# Patient Record
Sex: Male | Born: 1980 | Race: Black or African American | Hispanic: No | Marital: Single | Smoking: Never smoker
Health system: Southern US, Community
[De-identification: ages and names within clinical notes are randomized; demographics above are authoritative.]

## PROBLEM LIST (undated history)

## (undated) DIAGNOSIS — I517 Cardiomegaly: Secondary | ICD-10-CM

## (undated) HISTORY — PX: NASAL FRACTURE SURGERY: SHX718

## (undated) HISTORY — PX: HERNIA REPAIR: SHX51

## (undated) HISTORY — PX: KNEE SURGERY: SHX244

## (undated) HISTORY — PX: CIRCUMCISION: SUR203

---

## 2013-11-12 ENCOUNTER — Encounter (HOSPITAL_COMMUNITY): Payer: Self-pay | Admitting: Emergency Medicine

## 2013-11-12 ENCOUNTER — Emergency Department (INDEPENDENT_AMBULATORY_CARE_PROVIDER_SITE_OTHER)
Admission: EM | Admit: 2013-11-12 | Discharge: 2013-11-12 | Disposition: A | Discharge status: Home / Self Care | Payer: Self-pay | Source: Home / Self Care | Attending: Family Medicine | Admitting: Family Medicine

## 2013-11-12 ENCOUNTER — Other Ambulatory Visit (HOSPITAL_COMMUNITY)
Admission: RE | Admit: 2013-11-12 | Discharge: 2013-11-12 | Disposition: A | Discharge status: Home / Self Care | Payer: Self-pay | Source: Ambulatory Visit | Attending: Family Medicine | Admitting: Family Medicine

## 2013-11-12 DIAGNOSIS — N342 Other urethritis: Secondary | ICD-10-CM

## 2013-11-12 DIAGNOSIS — Z113 Encounter for screening for infections with a predominantly sexual mode of transmission: Secondary | ICD-10-CM | POA: Insufficient documentation

## 2013-11-12 LAB — RPR

## 2013-11-12 LAB — HIV ANTIBODY (ROUTINE TESTING W REFLEX): HIV: NONREACTIVE

## 2013-11-12 MED ORDER — AZITHROMYCIN 250 MG PO TABS
1000.0000 mg | ORAL_TABLET | Freq: Every day | ORAL | Status: DC
  Administered 2013-11-12: 1000 mg via ORAL

## 2013-11-12 MED ORDER — CEFTRIAXONE SODIUM 250 MG IJ SOLR
250.0000 mg | Freq: Once | INTRAMUSCULAR | Status: AC
  Administered 2013-11-12: 250 mg via INTRAMUSCULAR

## 2013-11-12 MED ORDER — LIDOCAINE HCL (PF) 1 % IJ SOLN
INTRAMUSCULAR | Status: AC
  Filled 2013-11-12: qty 5

## 2013-11-12 MED ORDER — METRONIDAZOLE 500 MG PO TABS: 2000.0000 mg | ORAL_TABLET | Freq: Once | ORAL | Status: DC

## 2013-11-12 MED ORDER — METRONIDAZOLE 500 MG PO TABS: 1000.0000 mg | ORAL_TABLET | Freq: Two times a day (BID) | ORAL | Status: DC

## 2013-11-12 MED ORDER — AZITHROMYCIN 250 MG PO TABS: 1000.0000 mg | ORAL_TABLET | Freq: Once | ORAL | Status: DC

## 2013-11-12 MED ORDER — AZITHROMYCIN 250 MG PO TABS
ORAL_TABLET | ORAL | Status: AC
  Filled 2013-11-12: qty 4

## 2013-11-12 MED ORDER — ONDANSETRON 4 MG PO TBDP
ORAL_TABLET | ORAL | Status: AC
  Filled 2013-11-12: qty 1

## 2013-11-12 MED ORDER — ONDANSETRON 4 MG PO TBDP: 4.0000 mg | ORAL_TABLET | Freq: Once | ORAL | Status: DC

## 2013-11-12 MED ORDER — CEFTRIAXONE SODIUM 250 MG IJ SOLR
INTRAMUSCULAR | Status: AC
  Filled 2013-11-12: qty 250

## 2013-11-12 NOTE — ED Notes (Signed)
No S/S allergic reaction. 

## 2013-11-12 NOTE — ED Provider Notes (Signed)
CSN: [id number]     Arrival date & time 11/12/13  1006 History   First MD Initiated Contact with Patient 11/12/13 1033     Chief Complaint  Patient presents with  . Penile Discharge   (Consider location/radiation/quality/duration/timing/severity/associated sxs/prior Treatment) HPI    33 year old male presents for evaluation of penile discharge. He states his wife recently told him that she has been sleeping with multiple different partners and she thinks she has an STD. He has had constant penile discharge and dysuria for 3 days. He has never had this before. He denies any other symptoms including no testicle pain, abdominal pain, fever, chills, rash, genital lesions.  History reviewed. No pertinent past medical history. History reviewed. No pertinent past surgical history. No family history on file. History  Substance Use Topics  . Smoking status: Never Smoker   . Smokeless tobacco: Not on file  . Alcohol Use: No    Review of Systems  Genitourinary: Positive for dysuria and discharge. Negative for penile swelling, genital sores, penile pain and testicular pain.  All other systems reviewed and are negative.   Allergies  Review of patient's allergies indicates no known allergies.  Home Medications   Prior to Admission medications   Medication Sig Start Date End Date Taking? Authorizing Provider  metroNIDAZOLE (FLAGYL) 500 MG tablet Take 2 tablets (1,000 mg total) by mouth 2 (two) times daily. 11/12/13   Adrian BlackwaterZachary H Reda Gettis, PA-C   BP 137/89  Pulse 82  Temp(Src) 98 F (36.7 C) (Oral)  Resp 16  SpO2 98% Physical Exam  Nursing note and vitals reviewed. Constitutional: He is oriented to person, place, and time. He appears well-developed and well-nourished. No distress.  HENT:  Head: Normocephalic.  Pulmonary/Chest: Effort normal. No respiratory distress.  Genitourinary: Testes normal. No penile tenderness. Discharge (thick white) found.  Lymphadenopathy:       Right: No  inguinal adenopathy present.       Left: No inguinal adenopathy present.  Neurological: He is alert and oriented to person, place, and time. Coordination normal.  Skin: Skin is warm and dry. No rash noted. He is not diaphoretic.  Psychiatric: He has a normal mood and affect. Judgment normal.    ED Course  Procedures (including critical care time) Labs Review Labs Reviewed  RPR  HIV ANTIBODY (ROUTINE TESTING)  URINE CYTOLOGY ANCILLARY ONLY    Imaging Review No results found.   MDM   1. Urethritis    Urine cytology sent. HIV and RPR sent. Will treat with ceftriaxone, azithromycin, metronidazole. Prophylactic Zofran.   Meds ordered this encounter  Medications  . cefTRIAXone (ROCEPHIN) injection 250 mg    Sig:   . azithromycin (ZITHROMAX) tablet 1,000 mg    Sig:   . DISCONTD: ondansetron (ZOFRAN-ODT) disintegrating tablet 4 mg    Sig:   . DISCONTD: metroNIDAZOLE (FLAGYL) tablet 2,000 mg    Sig:   . metroNIDAZOLE (FLAGYL) 500 MG tablet    Sig: Take 2 tablets (1,000 mg total) by mouth 2 (two) times daily.    Dispense:  4 tablet    Refill:  0    Order Specific Question:  Supervising Provider    Answer:  Clementeen GrahamOREY, EVAN, Kathie RhodesS [[id number]]       Graylon GoodZachary H Newell Frater, PA-C 11/12/13 [address]1036  Rawson Minix H CloverBaker, PA-C 11/12/13 1042

## 2013-11-12 NOTE — ED Notes (Signed)
Reports penile discharge x3 days.  States sexual partner has been having multiple partners.

## 2013-11-12 NOTE — Discharge Instructions (Signed)
Urethritis °Urethritis is an inflammation of the tube through which urine exits your bladder (urethra).  °CAUSES °Urethritis is often caused by an infection in your urethra. The infection can be viral, like herpes. The infection can also be bacterial, like gonorrhea. °RISK FACTORS °Risk factors of urethritis include: °· Having sex without using a condom. °· Having multiple sexual partners. °· Having poor hygiene. °SIGNS AND SYMPTOMS °Symptoms of urethritis are less noticeable in women than in men. These symptoms include: °· Burning feeling when you urinate (dysuria). °· Discharge from your urethra. °· Blood in your urine (hematuria). °· Urinating more than usual. °DIAGNOSIS  °To confirm a diagnosis of urethritis, your health care provider will do the following: °· Ask about your sexual history. °· Perform a physical exam. °· Have you provide a sample of your urine for lab testing. °· Use a cotton swab to gently collect a sample from your urethra for lab testing. °TREATMENT  °It is important to treat urethritis. Depending on the cause, untreated urethritis may lead to serious genital infections and possibly infertility. Urethritis caused by a bacterial infection is treated with antibiotic medicine. All sexual partners must be treated.  °HOME CARE INSTRUCTIONS °· Do not have sex until the test results are known and treatment is completed, even if your symptoms go away before you finish treatment. °· If you were prescribed an antibiotic, finish it all even if you start to feel better. °SEEK MEDICAL CARE IF:  °· Your symptoms are not improved in 3 days. °· Your symptoms are getting worse. °· You develop abdominal pain or pelvic pain (in women). °· You develop joint pain. °· You have a fever. °SEEK IMMEDIATE MEDICAL CARE IF:  °· You have severe pain in the belly, back, or side. °· You have repeated vomiting. °MAKE SURE YOU: °· Understand these instructions. °· Will watch your condition. °· Will get help right away if you  are not doing well or get worse. °Document Released: 07/08/2000 Document Revised: 05/29/2013 Document Reviewed: 09/12/2012 °[employer]® Patient Information ©2015 [employer]. This information is not intended to replace advice given to you by your health care provider. Make sure you discuss any questions you have with your health care provider. ° °

## 2013-11-13 LAB — URINE CYTOLOGY ANCILLARY ONLY
Chlamydia: NEGATIVE
Neisseria Gonorrhea: POSITIVE — AB
Trichomonas: NEGATIVE

## 2013-11-13 NOTE — ED Provider Notes (Signed)
Medical screening examination/treatment/procedure(s) were performed by a resident physician or non-physician practitioner and as the supervising physician I was immediately available for consultation/collaboration.  Lunette Tapp, MD    Katherleen Folkes S Aydien Majette, MD 11/13/13 0751 

## 2013-11-14 ENCOUNTER — Telehealth (HOSPITAL_COMMUNITY): Payer: Self-pay | Admitting: *Deleted
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
## 2014-09-28 ENCOUNTER — Emergency Department (HOSPITAL_COMMUNITY)
Admission: EM | Admit: 2014-09-28 | Discharge: 2014-09-28 | Discharge status: Absent without leave | Payer: Self-pay | Source: Home / Self Care

## 2014-09-29 ENCOUNTER — Emergency Department (HOSPITAL_COMMUNITY)
Admission: EM | Admit: 2014-09-29 | Discharge: 2014-09-29 | Disposition: A | Discharge status: Home / Self Care | Payer: Self-pay | Attending: Emergency Medicine | Admitting: Emergency Medicine

## 2014-09-29 ENCOUNTER — Encounter (HOSPITAL_COMMUNITY): Payer: Self-pay | Admitting: Emergency Medicine

## 2014-09-29 DIAGNOSIS — S0990XA Unspecified injury of head, initial encounter: Secondary | ICD-10-CM | POA: Insufficient documentation

## 2014-09-29 DIAGNOSIS — H538 Other visual disturbances: Secondary | ICD-10-CM | POA: Insufficient documentation

## 2014-09-29 DIAGNOSIS — Y998 Other external cause status: Secondary | ICD-10-CM | POA: Insufficient documentation

## 2014-09-29 DIAGNOSIS — Y92410 Unspecified street and highway as the place of occurrence of the external cause: Secondary | ICD-10-CM | POA: Insufficient documentation

## 2014-09-29 DIAGNOSIS — Z79899 Other long term (current) drug therapy: Secondary | ICD-10-CM | POA: Insufficient documentation

## 2014-09-29 DIAGNOSIS — S199XXA Unspecified injury of neck, initial encounter: Secondary | ICD-10-CM | POA: Insufficient documentation

## 2014-09-29 DIAGNOSIS — Y9389 Activity, other specified: Secondary | ICD-10-CM | POA: Insufficient documentation

## 2014-09-29 DIAGNOSIS — M546 Pain in thoracic spine: Secondary | ICD-10-CM

## 2014-09-29 DIAGNOSIS — S3992XA Unspecified injury of lower back, initial encounter: Secondary | ICD-10-CM | POA: Insufficient documentation

## 2014-09-29 MED ORDER — IBUPROFEN 800 MG PO TABS: 800.0000 mg | ORAL_TABLET | Freq: Three times a day (TID) | ORAL | Status: DC

## 2014-09-29 MED ORDER — ATROPINE SULFATE 1 % OP SOLN
1.0000 [drp] | Freq: Once | OPHTHALMIC | Status: AC
  Administered 2014-09-29: 1 [drp] via OPHTHALMIC
  Filled 2014-09-29: qty 2
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
## 2015-04-29 ENCOUNTER — Emergency Department (HOSPITAL_COMMUNITY): Payer: No Typology Code available for payment source

## 2015-04-29 ENCOUNTER — Encounter (HOSPITAL_COMMUNITY): Payer: Self-pay | Admitting: Emergency Medicine

## 2015-04-29 ENCOUNTER — Emergency Department (HOSPITAL_COMMUNITY)
Admission: EM | Admit: 2015-04-29 | Discharge: 2015-04-30 | Disposition: A | Discharge status: Home / Self Care | Payer: No Typology Code available for payment source | Attending: Emergency Medicine | Admitting: Emergency Medicine

## 2015-04-29 DIAGNOSIS — Y9389 Activity, other specified: Secondary | ICD-10-CM | POA: Diagnosis not present

## 2015-04-29 DIAGNOSIS — Y998 Other external cause status: Secondary | ICD-10-CM | POA: Insufficient documentation

## 2015-04-29 DIAGNOSIS — Z791 Long term (current) use of non-steroidal anti-inflammatories (NSAID): Secondary | ICD-10-CM | POA: Diagnosis not present

## 2015-04-29 DIAGNOSIS — S39012A Strain of muscle, fascia and tendon of lower back, initial encounter: Secondary | ICD-10-CM | POA: Insufficient documentation

## 2015-04-29 DIAGNOSIS — Z792 Long term (current) use of antibiotics: Secondary | ICD-10-CM | POA: Diagnosis not present

## 2015-04-29 DIAGNOSIS — Y92410 Unspecified street and highway as the place of occurrence of the external cause: Secondary | ICD-10-CM | POA: Insufficient documentation

## 2015-04-29 DIAGNOSIS — S161XXA Strain of muscle, fascia and tendon at neck level, initial encounter: Secondary | ICD-10-CM | POA: Insufficient documentation

## 2015-04-29 DIAGNOSIS — S3992XA Unspecified injury of lower back, initial encounter: Secondary | ICD-10-CM | POA: Diagnosis present

## 2015-04-29 MED ORDER — OXYCODONE-ACETAMINOPHEN 5-325 MG PO TABS
1.0000 | ORAL_TABLET | Freq: Once | ORAL | Status: AC
  Administered 2015-04-29: 1 via ORAL
  Filled 2015-04-29: qty 1

## 2015-04-29 MED ORDER — NAPROXEN 500 MG PO TABS
500.0000 mg | ORAL_TABLET | Freq: Once | ORAL | Status: AC
  Administered 2015-04-29: 500 mg via ORAL
  Filled 2015-04-29: qty 1
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
## 2015-04-30 MED ORDER — METHOCARBAMOL 500 MG PO TABS: 500.0000 mg | ORAL_TABLET | Freq: Two times a day (BID) | ORAL | Status: DC

## 2015-04-30 MED ORDER — NAPROXEN 500 MG PO TABS: 500.0000 mg | ORAL_TABLET | Freq: Two times a day (BID) | ORAL | Status: DC
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
## 2015-04-30 NOTE — ED Notes (Signed)
Pt reports understanding of discharge information. No questions at time of discharge 

## 2015-04-30 NOTE — ED Notes (Signed)
Awaiting radiology results and disposition.

## 2020-03-31 ENCOUNTER — Ambulatory Visit (HOSPITAL_COMMUNITY)
Admission: EM | Admit: 2020-03-31 | Discharge: 2020-03-31 | Disposition: A | Discharge status: Home / Self Care | Payer: 59 | Attending: Medical Oncology | Admitting: Medical Oncology

## 2020-03-31 ENCOUNTER — Encounter (HOSPITAL_COMMUNITY): Payer: Self-pay | Admitting: Emergency Medicine

## 2020-03-31 ENCOUNTER — Other Ambulatory Visit: Payer: Self-pay

## 2020-03-31 DIAGNOSIS — J069 Acute upper respiratory infection, unspecified: Secondary | ICD-10-CM | POA: Diagnosis not present

## 2020-03-31 DIAGNOSIS — Z20828 Contact with and (suspected) exposure to other viral communicable diseases: Secondary | ICD-10-CM

## 2020-03-31 DIAGNOSIS — H66003 Acute suppurative otitis media without spontaneous rupture of ear drum, bilateral: Secondary | ICD-10-CM

## 2020-03-31 MED ORDER — FLUTICASONE PROPIONATE 50 MCG/ACT NA SUSP: 2.0000 | Freq: Every day | NASAL | 2 refills | Status: DC

## 2020-03-31 MED ORDER — AMOXICILLIN-POT CLAVULANATE 875-125 MG PO TABS: 1.0000 | ORAL_TABLET | Freq: Two times a day (BID) | ORAL | 0 refills | Status: DC

## 2020-03-31 MED ORDER — BENZONATATE 100 MG PO CAPS: 100.0000 mg | ORAL_CAPSULE | Freq: Three times a day (TID) | ORAL | 0 refills | Status: DC

## 2020-03-31 NOTE — ED Triage Notes (Signed)
Pt is present today with nasal congestion, fever(102.7), and chills that started last Thursday. Pt states that he tried OTC medication and nothing has helped. Pt states that he was just tested for covid this past Monday and tested negative.

## 2020-03-31 NOTE — ED Provider Notes (Signed)
[facility]    CSN: [id number] Arrival date & time: 03/31/20  1002      History   Chief Complaint Chief Complaint  Patient presents with  . Nasal Congestion  . Fever  . Chills    HPI Oscar Bates is a 40 y.o. male.   HPI  Cough: Patient reports that for the past week he has had cough, nasal congestion, body aches and chills.  He has had 2 - Covid tests both on day 1 and 3 of illness.  He states that his symptoms seem to be getting worse as he is now having some ear fullness and pain as well as sinus congestion.  He denies any chest pain, shortness of breath or confusion.  He has been taking over-the-counter medications without much relief but it has helped with his fevers.  Of note 3 family members in his household have been diagnosed with influenza.   History reviewed. No pertinent past medical history.  There are no problems to display for this patient.   Past Surgical History:  Procedure Laterality Date  . HERNIA REPAIR    . KNEE SURGERY         Home Medications    Prior to Admission medications   Medication Sig Start Date End Date Taking? Authorizing Provider  ibuprofen (ADVIL,MOTRIN) 800 MG tablet Take 1 tablet (800 mg total) by mouth 3 (three) times daily. 09/29/14   Elpidio Anis, PA-C  methocarbamol (ROBAXIN) 500 MG tablet Take 1 tablet (500 mg total) by mouth 2 (two) times daily. 04/30/15   Antony Madura, PA-C  metroNIDAZOLE (FLAGYL) 500 MG tablet Take 2 tablets (1,000 mg total) by mouth 2 (two) times daily. 11/12/13   Autumn Messing H, PA-C  naproxen (NAPROSYN) 500 MG tablet Take 1 tablet (500 mg total) by mouth 2 (two) times daily. 04/30/15   Antony Madura, PA-C    Family History History reviewed. No pertinent family history.  Social History Social History   Tobacco Use  . Smoking status: Never Smoker  Substance Use Topics  . Alcohol use: Yes    Comment: occ  . Drug use: No     Allergies   Patient has no known allergies.   Review of  Systems Review of Systems  As stated above in HPI Physical Exam Triage Vital Signs ED Triage Vitals [03/31/20 1020]  Enc Vitals Group     BP (!) 145/91     Pulse Rate (!) 103     Resp 17     Temp 99.5 F (37.5 C)     Temp Source Oral     SpO2 99 %     Weight      Height      Head Circumference      Peak Flow      Pain Score      Pain Loc      Pain Edu?      Excl. in GC?    No data found.  Updated Vital Signs BP (!) 145/91 (BP Location: Right Arm)   Pulse (!) 103   Temp 99.5 F (37.5 C) (Oral)   Resp 17   SpO2 99%   Physical Exam Vitals and nursing note reviewed.  Constitutional:      General: He is not in acute distress.    Appearance: Normal appearance. He is not ill-appearing, toxic-appearing or diaphoretic.  HENT:     Head: Normocephalic.     Right Ear: Tympanic membrane is erythematous and bulging.  Left Ear: Tympanic membrane is erythematous and bulging.     Nose: Mucosal edema and congestion present.     Right Sinus: Maxillary sinus tenderness present.     Left Sinus: Maxillary sinus tenderness present.     Mouth/Throat:     Mouth: Mucous membranes are moist.     Palate: No mass and lesions.     Pharynx: Oropharynx is clear. Uvula midline. No pharyngeal swelling, oropharyngeal exudate, posterior oropharyngeal erythema or uvula swelling.     Tonsils: No tonsillar exudate or tonsillar abscesses.  Cardiovascular:     Rate and Rhythm: Normal rate and regular rhythm.     Heart sounds: Normal heart sounds.  Pulmonary:     Effort: Pulmonary effort is normal. No respiratory distress.     Breath sounds: Normal breath sounds. No stridor. No wheezing, rhonchi or rales.  Chest:     Chest wall: No tenderness.  Musculoskeletal:     Cervical back: Normal range of motion.  Lymphadenopathy:     Cervical: No cervical adenopathy.  Neurological:     General: No focal deficit present.     Mental Status: He is alert.  Psychiatric:        Mood and Affect: Mood  normal.        Behavior: Behavior normal.      UC Treatments / Results  Labs (all labs ordered are listed, but only abnormal results are displayed) Labs Reviewed  SARS CORONAVIRUS 2 (TAT 6-24 HRS)    EKG   Radiology No results found.  Procedures Procedures (including critical care time)  Medications Ordered in UC Medications - No data to display  Initial Impression / Assessment and Plan / UC Course  I have reviewed the triage vital signs and the nursing notes.  Pertinent labs & imaging results that were available during my care of the patient were reviewed by me and considered in my medical decision making (see chart for details).     New.  Discussed with patient that it appears that he has progressed into having an ear infection and sinus infection along with his influenza virus.  Although his lungs sound clear given his cough I am going to pick and antibiotic that will work both well in the respiratory tract and in the sinus tract.  Will treat with Augmentin, Tessalon and Flonase.  We discussed how to use along with common potential side effects and precautions.  Follow-up as needed and we discussed red flag signs and symptoms. Final Clinical Impressions(s) / UC Diagnoses   Final diagnoses:  None   Discharge Instructions   None    ED Prescriptions    None     PDMP not reviewed this encounter.   Rushie Chestnut, [place] 03/31/20 1040

## 2021-04-09 ENCOUNTER — Ambulatory Visit (HOSPITAL_COMMUNITY)
Admission: EM | Admit: 2021-04-09 | Discharge: 2021-04-09 | Disposition: A | Discharge status: Home / Self Care | Payer: 59 | Attending: Internal Medicine | Admitting: Internal Medicine

## 2021-04-09 ENCOUNTER — Other Ambulatory Visit: Payer: Self-pay

## 2021-04-09 ENCOUNTER — Ambulatory Visit (INDEPENDENT_AMBULATORY_CARE_PROVIDER_SITE_OTHER): Payer: 59

## 2021-04-09 ENCOUNTER — Encounter (HOSPITAL_COMMUNITY): Payer: Self-pay | Admitting: Emergency Medicine

## 2021-04-09 DIAGNOSIS — M25561 Pain in right knee: Secondary | ICD-10-CM

## 2021-04-09 MED ORDER — NAPROXEN 500 MG PO TABS: 500.0000 mg | ORAL_TABLET | Freq: Two times a day (BID) | ORAL | 0 refills | Status: DC

## 2021-04-09 NOTE — Discharge Instructions (Signed)
As we discussed, your x-ray does not show any signs of fracture.  You do have some swelling of your knee and concerning for injury within the knee joint itself.  We have placed you in a brace that you can use.  You can do activities as tolerated.  I recommend calling the sports medicine office to schedule a follow-up appointment as you may need further imaging to rule out any ligamentous injury.  In the meantime, you can take naproxen twice daily as needed for your pain.  Icing can also be helpful. ?

## 2021-04-09 NOTE — ED Provider Notes (Signed)
?[facility] ? ? ? ?CSN: [id number] ?Arrival date & time: 04/09/21  0806 ? ? ?  ? ?History   ?Chief Complaint ?Chief Complaint  ?Patient presents with  ? Knee Pain  ? ? ?HPI ?Oscar Bates is a 41 y.o. male.  ? 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 ? ?History reviewed. No pertinent past medical history. ? ?There are no problems to display for this patient. ? ? ?Past Surgical History:  ?Procedure Laterality Date  ? CIRCUMCISION    ? HERNIA REPAIR    ? KNEE SURGERY    ? NASAL FRACTURE SURGERY    ? ? ? ? ? ?Home Medications   ? ?Prior to Admission medications   ?Medication Sig Start Date End Date Taking? Authorizing Provider  ?naproxen (NAPROSYN) 500 MG tablet Take 1 tablet (500 mg total) by mouth 2 (two) times daily with a meal. 04/09/21  Yes Ruchi Stoney, Solmon Ice, DO  ?amoxicillin-clavulanate (AUGMENTIN) 875-125 MG tablet Take 1 tablet by mouth every 12 (twelve) hours. ?Patient not taking: Reported on 04/09/2021 03/31/20   Rushie Chestnut, PA-C  ?benzonatate (TESSALON) 100 MG capsule Take 1 capsule (100 mg total) by mouth every 8 (eight) hours. ?Patient not taking: Reported on 04/09/2021 03/31/20   Rushie Chestnut, PA-C  ?fluticasone [facility]) 50 MCG/ACT nasal spray Place 2 sprays into both nostrils daily. ?Patient not taking: Reported on 04/09/2021 03/31/20   Rushie Chestnut, PA-C  ? ? ?Family History ?Family History  ?Problem Relation Age of Onset  ? Healthy Mother   ? Healthy Father   ? ? ?Social History ?Social History  ? ?Tobacco Use  ? Smoking status: Never  ?Vaping Use  ? Vaping Use: Never used  ?Substance Use Topics  ? Alcohol use: Yes  ?  Comment: occ  ? Drug  use: No  ? ? ? ?Allergies   ?Patient has no known allergies. ? ? ?Review of Systems ?Review of Systems  ?All other systems reviewed and are negative. ?Per HPI ? ?Physical Exam ?Triage Vital Signs ?ED Triage Vitals  ?Enc Vitals Group  ?   BP   ?   Pulse   ?   Resp   ?   Temp   ?   Temp src   ?   SpO2   ?   Weight   ?   Height   ?   Head Circumference   ?   Peak Flow   ?   Pain Score   ?   Pain Loc   ?   Pain Edu?   ?   Excl. in GC?   ? ?No data found. ? ?Updated Vital Signs ?BP 130/76 (BP Location: Right Arm) Comment (BP Location): large cuff  Pulse 81   Temp 98.2 ?F (36.8 ?C) (Oral)   Resp 18   SpO2 96%  ? ?Visual Acuity ?Right Eye Distance:   ?Left Eye Distance:   ?Bilateral Distance:   ? ?Right Eye Near:   ?Left Eye Near:    ?Bilateral Near:    ? ?Physical Exam ?Constitutional:   ?   General: He is not in acute  distress. ?   Appearance: Normal appearance. He is not ill-appearing.  ?HENT:  ?   Head: Normocephalic and atraumatic.  ?Eyes:  ?   Conjunctiva/sclera: Conjunctivae normal.  ?Cardiovascular:  ?   Rate and Rhythm: Normal rate.  ?Pulmonary:  ?   Effort: Pulmonary effort is normal. No respiratory distress.  ?Musculoskeletal:  ?   Cervical back: Normal range of motion.  ?   Comments: Right Knee: ?- Inspection: There is an obvious effusion of the right knee compared to the left knee.  No erythema or bruising b/l. Skin intact ?- Palpation: no TTP b/l ?- ROM: Extension is limited by 5 degrees on the right, flexion is limited by 10 degrees on the right.  He has full range of motion of his left knee. ?- Strength: 5/5 strength b/l ?- Neuro/vasc: NV intact distally b/l ?- Special Tests: ?- LIGAMENTS: negative anterior and posterior drawer, positive Lachman's, no MCL or LCL laxity  ?-- MENISCUS: Equivocal McMurray's, difficult to test in acute setting ?-- PF JOINT: negative patellar apprehension ? ?  ?Skin: ?   General: Skin is warm and dry.  ?Neurological:  ?   Mental Status: He is alert and oriented to person,  place, and time.  ?Psychiatric:     ?   Mood and Affect: Mood normal.     ?   Behavior: Behavior normal.  ? ? ? ?UC Treatments / Results  ?Labs ?(all labs ordered are listed, but only abnormal results are displayed) ?Labs Reviewed - No data to display ? ?EKG ? ? ?Radiology ?DG Knee Complete 4 Views Right ? ?Result Date: 04/09/2021 ?CLINICAL DATA:  Trauma, pain EXAM: RIGHT KNEE - COMPLETE 4+ VIEW COMPARISON:  None. FINDINGS: No recent fracture or dislocation is seen. There is soft tissue fullness in the suprapatellar bursa. Small bony spurs seen in the medial, lateral and patellofemoral compartments. IMPRESSION: No recent fracture or dislocation is seen in the right knee. Moderate effusion is present in the suprapatellar bursa. Degenerative changes are noted with bony spurs. Electronically Signed   By: Ernie Avena M.D.   On: 04/09/2021 09:05   ? ?Procedures ?Procedures (including critical care time) ? ?Medications Ordered in UC ?Medications - No data to display ? ?Initial Impression / Assessment and Plan / UC Course  ?I have reviewed the triage vital signs and the nursing notes. ? ?Pertinent labs & imaging results that were available during my care of the patient were reviewed by me and considered in my medical decision making (see chart for details). ? ?  ? ?X-ray negative for fracture.  Patient with acute injury and acute effusion, concern for ligamentous injury.  Exam difficult to perform at this time, but possibly some laxity with Lachman's and an equivocal McMurray's.  Will refer to sports medicine and given a J and J brace.  Naproxen as needed for pain and inflammation. ? ? ?Final Clinical Impressions(s) / UC Diagnoses  ? ?Final diagnoses:  ?Acute pain of right knee  ? ? ? ?Discharge Instructions   ? ?  ?As we discussed, your x-ray does not show any signs of fracture.  You do have some swelling of your knee and concerning for injury within the knee joint itself.  We have placed you in a brace that you  can use.  You can do activities as tolerated.  I recommend calling the sports medicine office to schedule a follow-up appointment as you may need further imaging to rule out any ligamentous injury.  In the  meantime, you can take naproxen twice daily as needed for your pain.  Icing can also be helpful. ? ? ? ? ?ED Prescriptions   ? ? Medication Sig Dispense Auth. Provider  ? naproxen (NAPROSYN) 500 MG tablet Take 1 tablet (500 mg total) by mouth 2 (two) times daily with a meal. 30 tablet Daryon Remmert, Solmon IceBailey J, DO  ? ?  ? ?PDMP not reviewed this encounter. ?  ?Unknown JimMeccariello, Jamesha Ellsworth J, DO ?04/09/21 0912 ? ?

## 2021-04-09 NOTE — ED Triage Notes (Signed)
Right knee pain for 3 days.  Slipped on pine needles, patient fell awkwardly ?

## 2021-04-14 ENCOUNTER — Ambulatory Visit (INDEPENDENT_AMBULATORY_CARE_PROVIDER_SITE_OTHER): Payer: 59 | Admitting: Family Medicine

## 2021-04-14 ENCOUNTER — Encounter: Payer: Self-pay | Admitting: Family Medicine

## 2021-04-14 VITALS — BP 162/102 | Ht 70.0 in | Wt 185.0 lb

## 2021-04-14 DIAGNOSIS — M25561 Pain in right knee: Secondary | ICD-10-CM | POA: Diagnosis not present

## 2021-04-14 NOTE — Patient Instructions (Signed)
I'm concerned about your ACL. ?We will go ahead with an MRI of your knee. ?Wear the brace when up and walking around, especially at work. ?Take naproxen twice a day with food as needed. ?Be careful on irregular ground. ?Ice 15 minutes at a time as needed to help with swelling. ?I'll call you with the MRI results and next steps. ?

## 2021-04-14 NOTE — Progress Notes (Signed)
PCP: Pcp, No ? ?Subjective:  ? ?HPI: ?Patient is a 41 y.o. male here for right knee pain. ? 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 ?Immediate pain, swelling of his right knee. ?Was seen at urgent care, concern for ligamentous injury of right knee. ?Felt unstable initially. ?He has been taking naproxen, using brace when up and walking around and at work. ?Swelling has been improving. ? ?History reviewed. No pertinent past medical history. ? ?Current Outpatient Medications on File Prior to Visit  ?Medication Sig Dispense Refill  ? amoxicillin-clavulanate (AUGMENTIN) 875-125 MG tablet Take 1 tablet by mouth every 12 (twelve) hours. (Patient not taking: Reported on 04/09/2021) 14 tablet 0  ? benzonatate (TESSALON) 100 MG capsule Take 1 capsule (100 mg total) by mouth every 8 (eight) hours. (Patient not taking: Reported on 04/09/2021) 21 capsule 0  ? fluticasone (FLONASE) 50 MCG/ACT nasal spray Place 2 sprays into both nostrils daily. (Patient not taking: Reported on 04/09/2021) 16 g 2  ? naproxen (NAPROSYN) 500 MG tablet Take 1 tablet (500 mg total) by mouth 2 (two) times daily with a meal. 30 tablet 0  ? ?No current facility-administered medications on file prior to visit.  ? ? ?Past Surgical History:  ?Procedure Laterality Date  ? CIRCUMCISION    ? HERNIA REPAIR    ? KNEE SURGERY    ? NASAL FRACTURE SURGERY    ? ? ?No Known Allergies ? ?BP (!) 162/102   Ht 5\' 10"  (1.778 m)   Wt 185 lb (83.9 kg)   BMI 26.54 kg/m?  ? ?[place] Adult Exercise 04/14/2021  ?Frequency of aerobic exercise (# of days/week) 5  ?Average time in minutes 90  ?Frequency of strengthening activities (# of days/week) 5  ? ? ?No flowsheet data found. ? ?    ?Objective:  ?Physical Exam: ? ?Gen: NAD, comfortable in exam room ? ?Right knee: ?Mild effusion.  No other gross deformity, ecchymoses. ?No joint line TTP. ?FROM with normal strength. ?Positive  ant drawer, lachman, lever.  Negative post drawer.  Negative valgus/varus testing.  ?Negative mcmurrays, apleys, thessalys. ?NV intact distally. ?  ?Assessment & Plan:  ?1. Right knee injury - due to twisting injury with immediate effusion.  Radiographs negative for fracture.  Exam concerning for ACL tear.  Will go ahead with MRI to confirm, refer to ortho.  Continue knee brace, naproxen, icing. ?

## 2021-04-19 ENCOUNTER — Ambulatory Visit
Admission: RE | Admit: 2021-04-19 | Discharge: 2021-04-19 | Disposition: A | Discharge status: Home / Self Care | Payer: 59 | Source: Ambulatory Visit | Attending: Family Medicine | Admitting: Family Medicine

## 2021-04-19 ENCOUNTER — Other Ambulatory Visit: Payer: Self-pay

## 2021-04-19 DIAGNOSIS — M25561 Pain in right knee: Secondary | ICD-10-CM

## 2021-04-23 NOTE — Progress Notes (Signed)
Referral to Dr. Griffin Basil for right knee ACL tear and medial/lateral meniscus tears. ?[facility] ?[address], [place] ?[phone] ? ?Appt: 04/25/2021 @ 9 am. ?

## 2021-06-20 ENCOUNTER — Emergency Department: Payer: 59

## 2021-06-20 ENCOUNTER — Emergency Department
Admission: EM | Admit: 2021-06-20 | Discharge: 2021-06-20 | Discharge status: Against Medical Advice | Payer: 59 | Attending: Emergency Medicine | Admitting: Emergency Medicine

## 2021-06-20 ENCOUNTER — Other Ambulatory Visit: Payer: Self-pay

## 2021-06-20 DIAGNOSIS — R55 Syncope and collapse: Secondary | ICD-10-CM | POA: Insufficient documentation

## 2021-06-20 DIAGNOSIS — Z5321 Procedure and treatment not carried out due to patient leaving prior to being seen by health care provider: Secondary | ICD-10-CM | POA: Diagnosis not present

## 2021-06-20 DIAGNOSIS — R42 Dizziness and giddiness: Secondary | ICD-10-CM | POA: Diagnosis not present

## 2021-06-20 DIAGNOSIS — R079 Chest pain, unspecified: Secondary | ICD-10-CM | POA: Diagnosis present

## 2021-06-20 LAB — CBC
HCT: 44.1 % (ref 39.0–52.0)
Hemoglobin: 14.8 g/dL (ref 13.0–17.0)
MCH: 30 pg (ref 26.0–34.0)
MCHC: 33.6 g/dL (ref 30.0–36.0)
MCV: 89.5 fL (ref 80.0–100.0)
Platelets: 231 10*3/uL (ref 150–400)
RBC: 4.93 MIL/uL (ref 4.22–5.81)
RDW: 15.1 % (ref 11.5–15.5)
WBC: 7.8 10*3/uL (ref 4.0–10.5)
nRBC: 0 % (ref 0.0–0.2)

## 2021-06-20 LAB — COMPREHENSIVE METABOLIC PANEL
ALT: 30 U/L (ref 0–44)
AST: 34 U/L (ref 15–41)
Albumin: 4.1 g/dL (ref 3.5–5.0)
Alkaline Phosphatase: 55 U/L (ref 38–126)
Anion gap: 8 (ref 5–15)
BUN: 20 mg/dL (ref 6–20)
CO2: 22 mmol/L (ref 22–32)
Calcium: 9.2 mg/dL (ref 8.9–10.3)
Chloride: 106 mmol/L (ref 98–111)
Creatinine, Ser: 1.11 mg/dL (ref 0.61–1.24)
GFR, Estimated: >60 mL/min (ref >60)
Glucose, Bld: 102 mg/dL — ABNORMAL HIGH (ref 70–99)
Potassium: 4.1 mmol/L (ref 3.5–5.1)
Sodium: 136 mmol/L (ref 135–145)
Total Bilirubin: 0.8 mg/dL (ref 0.3–1.2)
Total Protein: 7.1 g/dL (ref 6.5–8.1)

## 2021-06-20 LAB — TROPONIN I (HIGH SENSITIVITY): Troponin I (High Sensitivity): 3 ng/L (ref <18)

## 2021-06-20 NOTE — ED Notes (Signed)
Called to room without answer.  

## 2021-06-20 NOTE — ED Triage Notes (Signed)
Pt presents to ED with c/o of CP for the past 3 days and states today he became dizzy and then had a syncopal episode. Pt denies any cardiac issues at this time. Pt is A&OX4.

## 2021-06-20 NOTE — ED Notes (Signed)
Attempted to call pt to room without answer.

## 2021-06-26 ENCOUNTER — Ambulatory Visit (HOSPITAL_COMMUNITY): Admission: EM | Admit: 2021-06-26 | Discharge: 2021-06-26 | Discharge status: Against Medical Advice | Payer: 59

## 2021-06-26 NOTE — ED Notes (Signed)
Patient called in lobby, no answer

## 2021-06-26 NOTE — ED Notes (Signed)
Went to lobby called patient, no answer.

## 2021-06-27 ENCOUNTER — Encounter (HOSPITAL_COMMUNITY): Payer: Self-pay | Admitting: Emergency Medicine

## 2021-06-27 ENCOUNTER — Ambulatory Visit (HOSPITAL_COMMUNITY)
Admission: EM | Admit: 2021-06-27 | Discharge: 2021-06-27 | Disposition: A | Discharge status: Other Acute Inpatient Hospital | Payer: 59

## 2021-06-27 DIAGNOSIS — R55 Syncope and collapse: Secondary | ICD-10-CM

## 2021-06-27 NOTE — ED Triage Notes (Signed)
Patient c/o RT sided chest pain x 1 week.   Patient denies Headache.   Patient endorses pain is more noticeable at night time. Patient endorses SOB at night.   Patient has an episode of LOC upon onset of symptoms.   Patient had another episode of LOC 2 days ago in the shower. Patient states " I was in the shower then I reached up for the soap and I started to fell dizzy then my girlfriend found me on the floor but I remember what happened".   Patient denies hitting head during second episode.   Patient has taken Tylenol with some relief of pain.   Denies history of Cardiac or any significant health problems.

## 2021-06-27 NOTE — Discharge Instructions (Addendum)
-  Please head to the ED for further evaluation of your symptoms. I think you probably need labwork and imaging that we can't perform at urgent care. Please head straight there, stop and call [phone] if symptoms worsen on the way.

## 2021-06-27 NOTE — ED Provider Notes (Signed)
[facility]    CSN: [id number] Arrival date & time: 06/27/21  1224      History   Chief Complaint Chief Complaint  Patient presents with   Chest Pain    HPI Arthor Gorter is a 41 y.o. male presenting with multiple episodes of syncope in the last week.  History noncontributory.  He states he initially had a syncopal episode on 5/26, after this developed some right-sided chest pain and shortness of breath.  Presented to the emergency department, where he did not wait to be seen.  They did do a chest x-ray which was normal, but no other other interventions were performed at that time.  Patient states that he then had another episode of loss of consciousness 2 days ago on 5/31, he states that he passed out and fell down, but he does not think that he hit his head.  He currently does not have any headaches, vision changes, dizziness, left-sided chest pain, shortness of breath.  Has taken Tylenol with some relief.  He denies any known history of cardiovascular or pulmonary issues.  No recent travel or prolonged immobilization.  HPI  History reviewed. No pertinent past medical history.  There are no problems to display for this patient.   Past Surgical History:  Procedure Laterality Date   CIRCUMCISION     HERNIA REPAIR     KNEE SURGERY     NASAL FRACTURE SURGERY         Home Medications    Prior to Admission medications   Medication Sig Start Date End Date Taking? Authorizing Provider  acetaminophen (TYLENOL) 500 MG tablet Take 500 mg by mouth every 6 (six) hours as needed.   Yes [provider]    Family History Family History  Problem Relation Age of Onset   Healthy Mother    Healthy Father     Social History Social History   Tobacco Use   Smoking status: Never  Vaping Use   Vaping Use: Never used  Substance Use Topics   Alcohol use: Yes    Comment: occ   Drug use: No     Allergies   Patient has no known allergies.   Review of  Systems Review of Systems  Respiratory:  Positive for shortness of breath.   Neurological:  Positive for syncope.  All other systems reviewed and are negative.   Physical Exam Triage Vital Signs ED Triage Vitals  Enc Vitals Group     BP 06/27/21 1243 (!) 148/99     Pulse Rate 06/27/21 1243 77     Resp 06/27/21 1243 16     Temp 06/27/21 1243 98.5 F (36.9 C)     Temp Source 06/27/21 1243 Oral     SpO2 06/27/21 1243 97 %     Weight --      Height --      Head Circumference --      Peak Flow --      Pain Score 06/27/21 1250 9     Pain Loc --      Pain Edu? --      Excl. in GC? --    No data found.  Updated Vital Signs BP (!) 148/99 (BP Location: Left Arm)   Pulse 77   Temp 98.5 F (36.9 C) (Oral)   Resp 16   SpO2 97%   Visual Acuity Right Eye Distance:   Left Eye Distance:   Bilateral Distance:    Right Eye Near:  Left Eye Near:    Bilateral Near:     Physical Exam Vitals reviewed.  Constitutional:      Appearance: Normal appearance. He is not diaphoretic.  HENT:     Head: Normocephalic and atraumatic.     Mouth/Throat:     Mouth: Mucous membranes are moist.  Eyes:     Extraocular Movements: Extraocular movements intact.     Pupils: Pupils are equal, round, and reactive to light.  Cardiovascular:     Rate and Rhythm: Normal rate and regular rhythm.     Pulses:          Radial pulses are 2+ on the right side and 2+ on the left side.     Heart sounds: Normal heart sounds.  Pulmonary:     Effort: Pulmonary effort is normal.     Breath sounds: Normal breath sounds.  Chest:     Chest wall: Tenderness present.     Comments: Right-sided anterior chest wall pain to palpation  Abdominal:     Palpations: Abdomen is soft.     Tenderness: There is no abdominal tenderness. There is no guarding or rebound.  Musculoskeletal:     Right lower leg: No edema.     Left lower leg: No edema.  Skin:    General: Skin is warm.     Capillary Refill: Capillary refill  takes less than 2 seconds.  Neurological:     General: No focal deficit present.     Mental Status: He is alert and oriented to person, place, and time.     Comments: PERRLA, EOMI. Negative fingers to thumb. Grip strength 5/5 bilaterally.  Psychiatric:        Mood and Affect: Mood normal.        Behavior: Behavior normal.        Thought Content: Thought content normal.        Judgment: Judgment normal.     UC Treatments / Results  Labs (all labs ordered are listed, but only abnormal results are displayed) Labs Reviewed - No data to display  EKG   Radiology No results found.  Procedures Procedures (including critical care time)  Medications Ordered in UC Medications - No data to display  Initial Impression / Assessment and Plan / UC Course  I have reviewed the triage vital signs and the nursing notes.  Pertinent labs & imaging results that were available during my care of the patient were reviewed by me and considered in my medical decision making (see chart for details).     This patient is a very pleasant 41 y.o. year old male presenting with multiple syncopal episodes in the last week. Currently with right-sided chest wall tenderness to palpation. Reassuring neuro exam. EKG NSR. Sent to ED via POV, he is in agreement.   Final Clinical Impressions(s) / UC Diagnoses   Final diagnoses:  Syncope, unspecified syncope type     Discharge Instructions      -Please head to the ED for further evaluation of your symptoms. I think you probably need labwork and imaging that we can't perform at urgent care. Please head straight there, stop and call 911 if symptoms worsen on the way.    ED Prescriptions   None    PDMP not reviewed this encounter.   Rhys Martini, PA-C 06/27/21 1340

## 2021-06-27 NOTE — ED Notes (Signed)
Patient is being discharged from the Urgent Care and sent to the Emergency Department via Personal Vehicle . Per Amedeo Plenty, patient is in need of higher level of care due to Chest Pain and LOC. Patient is aware and verbalizes understanding of plan of care.  Vitals:   06/27/21 1243  BP: (!) 148/99  Pulse: 77  Resp: 16  Temp: 98.5 F (36.9 C)  SpO2: 97%

## 2021-07-10 ENCOUNTER — Encounter: Payer: Self-pay | Admitting: Nurse Practitioner

## 2021-07-10 ENCOUNTER — Ambulatory Visit (INDEPENDENT_AMBULATORY_CARE_PROVIDER_SITE_OTHER): Payer: 59 | Admitting: Nurse Practitioner

## 2021-07-10 VITALS — BP 134/79 | HR 87 | Temp 98.4°F | Ht 70.08 in | Wt 181.3 lb

## 2021-07-10 DIAGNOSIS — Z7689 Persons encountering health services in other specified circumstances: Secondary | ICD-10-CM | POA: Diagnosis not present

## 2021-07-10 DIAGNOSIS — R079 Chest pain, unspecified: Secondary | ICD-10-CM

## 2021-07-10 DIAGNOSIS — Z0000 Encounter for general adult medical examination without abnormal findings: Secondary | ICD-10-CM | POA: Diagnosis not present

## 2021-07-10 NOTE — Progress Notes (Signed)
Ok thanks 

## 2021-07-10 NOTE — Progress Notes (Signed)
Reviewed ECG during time of patient's visit he was referred to cardiology for further evaluation.

## 2021-07-10 NOTE — Progress Notes (Signed)
New Patient Office Visit  Subjective    Patient ID: Oscar Bates, male    DOB: 22-Jul-1980  Age: 41 y.o. 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 Has been seen in the ER for this twice.  -chest x-ray done at initial visit to ER was normal.  -ECG done both visits with ER were within normal limits.  -denies shortness of breath -he denies smoking. -Denies family history of cardiovascular disease. -does report increase in stress which are family related. Unsure if there is a link between increased stress and chest pain   Outpatient Encounter Medications as of 07/10/2021  Medication Sig   [DISCONTINUED] acetaminophen (TYLENOL) 500 MG tablet Take 500 mg by mouth every 6 (six) hours as needed.   No facility-administered encounter medications on file as of 07/10/2021.    History reviewed. No pertinent past medical history.  Past Surgical History:  Procedure Laterality Date   CIRCUMCISION     HERNIA REPAIR     KNEE SURGERY     NASAL FRACTURE SURGERY      Family History  Problem Relation Age of Onset   Healthy Mother    Healthy Father     Social History   Socioeconomic History   Marital status: Single    Spouse name: Not on file   Number of children: Not on file   Years of education: Not on file   Highest education level: Not on file  Occupational History   Not on file  Tobacco Use   Smoking status: Never   Smokeless tobacco: Not on file  Vaping Use   Vaping Use: Never used  Substance and Sexual Activity   Alcohol use: Yes    Comment: occ   Drug use: No   Sexual activity: Yes    Birth control/protection: None  Other Topics Concern   Not on file  Social History Narrative   Not on file   Social Determinants of  Health   Financial Resource Strain: Not on file  Food Insecurity: Not on file  Transportation Needs: Not on file  Physical Activity: Not on file  Stress: Not on file  Social Connections: Not on file  Intimate Partner Violence: Not on file    Review of Systems  Constitutional:  Negative for chills, fever and malaise/fatigue.  HENT:  Negative for congestion, sinus pain and sore throat.   Eyes: Negative.   Respiratory:  Positive for shortness of breath. Negative for cough and wheezing.   Cardiovascular:  Positive for chest pain and palpitations. Negative for leg swelling.  Gastrointestinal:  Negative for constipation, diarrhea, nausea and vomiting.  Genitourinary: Negative.   Musculoskeletal:  Negative for myalgias.  Skin: Negative.   Neurological:  Negative for dizziness and headaches.  Endo/Heme/Allergies:  Does not bruise/bleed easily.  Psychiatric/Behavioral:  Negative for depression. The patient is nervous/anxious.         Objective    Today's Vitals   07/10/21 1026  BP: 134/79  Pulse: 87  Temp: 98.4 F (36.9 C)  SpO2: 97%  Weight: 181 lb 4.8 oz (82.2 kg)  Height: 5' 10.08" (1.78 m)   Body mass index is 25.96 kg/m.   Physical Exam Vitals and nursing  note reviewed.  Constitutional:      Appearance: Normal appearance. He is well-developed.  HENT:     Head: Normocephalic and atraumatic.     Nose: Nose normal.  Eyes:     Extraocular Movements: Extraocular movements intact.     Conjunctiva/sclera: Conjunctivae normal.     Pupils: Pupils are equal, round, and reactive to light.  Neck:     Vascular: No carotid bruit.  Cardiovascular:     Rate and Rhythm: Normal rate and regular rhythm.     Pulses: Normal pulses.     Heart sounds: Normal heart sounds.     Comments: ECG within normal limits today  Pulmonary:     Effort: Pulmonary effort is normal.     Breath sounds: Normal breath sounds.  Chest:     Chest wall: No mass, swelling, tenderness, crepitus or  edema.  Abdominal:     Palpations: Abdomen is soft.  Musculoskeletal:        General: Normal range of motion.     Cervical back: Normal range of motion and neck supple.  Lymphadenopathy:     Cervical: No cervical adenopathy.  Skin:    General: Skin is warm and dry.     Capillary Refill: Capillary refill takes less than 2 seconds.  Neurological:     General: No focal deficit present.     Mental Status: He is alert and oriented to person, place, and time.  Psychiatric:        Mood and Affect: Mood normal.        Behavior: Behavior normal.        Thought Content: Thought content normal.        Judgment: Judgment normal.         Assessment & Plan:  1. Right-sided chest pain Reviewed recent ER notes which show normal chest x-ray along with normal ECG. ECG in the office is normal. No reproducible chest tenderness with palpation. Refer to cardiology for further evaluation.  - Ambulatory referral to Cardiology - EKG 12-Lead  2. Encounter to establish care Appointment today to establish new primary care provider      Problem List Items Addressed This Visit       Other   Right-sided chest pain - Primary   Relevant Orders   Ambulatory referral to Cardiology   EKG 12-Lead (Completed)   Other Visit Diagnoses     Encounter to establish care           Return in about 3 weeks (around 07/31/2021) for health maintenance exam, FBW a week prior to visit.   Carlean Jews, NP

## 2021-07-11 DIAGNOSIS — R079 Chest pain, unspecified: Secondary | ICD-10-CM | POA: Insufficient documentation

## 2021-07-17 ENCOUNTER — Ambulatory Visit (INDEPENDENT_AMBULATORY_CARE_PROVIDER_SITE_OTHER): Payer: 59 | Admitting: Internal Medicine

## 2021-07-17 ENCOUNTER — Encounter: Payer: Self-pay | Admitting: Internal Medicine

## 2021-07-17 ENCOUNTER — Ambulatory Visit: Payer: 59

## 2021-07-17 ENCOUNTER — Other Ambulatory Visit: Payer: Self-pay | Admitting: Internal Medicine

## 2021-07-17 VITALS — BP 130/90 | HR 86 | Ht 70.0 in | Wt 188.2 lb

## 2021-07-17 DIAGNOSIS — R55 Syncope and collapse: Secondary | ICD-10-CM

## 2021-07-17 NOTE — Progress Notes (Signed)
Cardiology Office Note:    Date:  07/17/2021   ID:  Oscar Bates, DOB 1980/05/27, MRN [id number]  PCP:  Carlean Jews, NP   [facility] Providers Cardiologist:  None     Referring MD: Carlean Jews, NP   No chief complaint on file. Syncope  History of Present Illness:    Oscar Bates is a 41 y.o. male ED referral for repeated syncopal episodes. Also described R sided chest wall pain. EKG was normal. Normal Qtc. He notes he was walking at work and then he passed out. He had no prodrome. He was walking from is car to another location and the next thing he knows, he was surrounded by his coworkers. He went to the ED and wait time was too long, so he left. Later, he was taking a shower and felt himself getting dizzy and presyncopal. His wife encouraged him to go to the ED. EKG was normal. No real conclusion in the ED. He also notes intermittent sharp chest pains on the R side. He takes tylenol and it that helps. No cardiac family hx. No sudden CD in family. This has never happened before. No new medicines, no dehydration prior.  He denies dizziness or LH. He is otherwise healthy    Past Surgical History:  Procedure Laterality Date   CIRCUMCISION     HERNIA REPAIR     KNEE SURGERY     NASAL FRACTURE SURGERY      Current Medications: No outpatient medications have been marked as taking for the 07/17/21 encounter (Appointment) with Maisie Fus, MD.     Allergies:   Patient has no known allergies.   Social History   Socioeconomic History   Marital status: Single    Spouse name: Not on file   Number of children: Not on file   Years of education: Not on file   Highest education level: Not on file  Occupational History   Not on file  Tobacco Use   Smoking status: Never   Smokeless tobacco: Not on file  Vaping Use   Vaping Use: Never used  Substance and Sexual Activity   Alcohol use: Yes    Comment: occ   Drug use: No   Sexual activity: Yes    Birth  control/protection: None  Other Topics Concern   Not on file  Social History Narrative   Not on file   Social Determinants of Health   Financial Resource Strain: Not on file  Food Insecurity: Not on file  Transportation Needs: Not on file  Physical Activity: Not on file  Stress: Not on file  Social Connections: Not on file     Family History: The patient's family history includes Healthy in his father and mother.  ROS:   Please see the history of present illness.     All other systems reviewed and are negative.  EKGs/Labs/Other Studies Reviewed:    The following studies were reviewed today:   EKG:  none today   Recent Labs: 06/20/2021: ALT 30; BUN 20; Creatinine, Ser 1.11; Hemoglobin 14.8; Platelets 231; Potassium 4.1; Sodium 136   Recent Lipid Panel No results found for: "CHOL", "TRIG", "HDL", "CHOLHDL", "VLDL", "LDLCALC", "LDLDIRECT"   Risk Assessment/Calculations:           Physical Exam:    VS:    Vitals:   07/17/21 1557  BP: 130/90  Pulse: 86  SpO2: 97%     Wt Readings from Last 3 Encounters:  07/10/21 181 lb 4.8  oz (82.2 kg)  06/20/21 190 lb (86.2 kg)  04/14/21 185 lb (83.9 kg)     GEN:  Well nourished, well developed in no acute distress HEENT: Normal NECK: No JVD; No carotid bruits LYMPHATICS: No lymphadenopathy CARDIAC: RRR, no murmurs, rubs, gallops RESPIRATORY:  Clear to auscultation without rales, wheezing or rhonchi  ABDOMEN: Soft, non-tender, non-distended MUSCULOSKELETAL:  No edema; No deformity  SKIN: Warm and dry NEUROLOGIC:  Alert and oriented x 3 PSYCHIATRIC:  Normal affect   ASSESSMENT:    Syncope: With sudden loss of consciousness and no prodrome, more concerning for arrhythmia. Will obtain real-time cardiac monitor and TTE.  PLAN:    In order of problems listed above:  Preventiss 2 weeks TTE Follow up in 3 months           Medication Adjustments/Labs and Tests Ordered: Current medicines are reviewed at  length with the patient today.  Concerns regarding medicines are outlined above.  No orders of the defined types were placed in this encounter.  No orders of the defined types were placed in this encounter.   There are no Patient Instructions on file for this visit.   Signed, Maisie Fus, MD  07/17/2021 3:53 PM    [facility]

## 2021-07-17 NOTE — Patient Instructions (Signed)
Medication Instructions:  The current medical regimen is effective;  continue present plan and medications as directed. Please refer to the Current Medication list given to you today.   *If you need a refill on your cardiac medications before your next appointment, please call your pharmacy*  Lab Work:    NONE     If you have labs (blood work) drawn today and your tests are completely normal, you will receive your results only by: MyChart Message (if you have MyChart) OR  A paper copy in the mail If you have any lab test that is abnormal or we need to change your treatment, we will call you to review the results.  Testing/Procedures:  2 WEEK LIVE MONITOR  Echocardiogram - Your physician has requested that you have an echocardiogram. Echocardiography is a painless test that uses sound waves to create images of your heart. It provides your doctor with information about the size and shape of your heart and how well your heart's chambers and valves are working. This procedure takes approximately one hour. There are no restrictions for this procedure.   Follow-Up: Your next appointment:  3 month(s) In Person with Branch, Alben Spittle, MD     At [facility], you and your health needs are our priority.  As part of our continuing mission to provide you with exceptional heart care, we have created designated Provider Care Teams.  These Care Teams include your primary Cardiologist (physician) and Advanced Practice Providers (APPs -  Physician Assistants and Nurse Practitioners) who all work together to provide you with the care you need, when you need it.  Important Information About Sugar

## 2021-07-17 NOTE — Progress Notes (Unsigned)
Enrolled for [employer] to ship a 14 day cardiac event monitor.

## 2021-07-20 ENCOUNTER — Ambulatory Visit
Admission: EM | Admit: 2021-07-20 | Discharge: 2021-07-20 | Disposition: A | Discharge status: Home / Self Care | Payer: 59

## 2021-07-20 DIAGNOSIS — R030 Elevated blood-pressure reading, without diagnosis of hypertension: Secondary | ICD-10-CM | POA: Diagnosis not present

## 2021-07-26 ENCOUNTER — Ambulatory Visit (INDEPENDENT_AMBULATORY_CARE_PROVIDER_SITE_OTHER): Payer: 59

## 2021-07-26 DIAGNOSIS — R55 Syncope and collapse: Secondary | ICD-10-CM

## 2021-07-30 ENCOUNTER — Ambulatory Visit (HOSPITAL_COMMUNITY): Payer: 59 | Attending: Cardiology

## 2021-07-30 DIAGNOSIS — R55 Syncope and collapse: Secondary | ICD-10-CM | POA: Insufficient documentation

## 2021-07-30 LAB — ECHOCARDIOGRAM COMPLETE
Area-P 1/2: 3.69 cm2
S' Lateral: 3 cm

## 2021-07-30 NOTE — Progress Notes (Unsigned)
Complete physical exam   Patient: Oscar Bates   DOB: [birth year]   41 y.o. Male  MRN: [id number] Visit Date: 07/31/2021    No chief complaint on file.  Subjective    Oscar Bates is a 41 y.o. male who presents today for a complete physical exam.  He reports consuming a {diet types:17450} diet. {Exercise:19826} He generally feels {well/fairly well/poorly:18703}. He {does/does not:200015} have additional problems to discuss today.  HPI  Annual physical -referred to cardiology due to nonspecific chest pain.  -recently went to ER due to elevated blood pressure.  --no new medications started.. patient keeping log.   No past medical history on file. Past Surgical History:  Procedure Laterality Date   CIRCUMCISION     HERNIA REPAIR     KNEE SURGERY     NASAL FRACTURE SURGERY     Social History   Socioeconomic History   Marital status: Single    Spouse name: Not on file   Number of children: Not on file   Years of education: Not on file   Highest education level: Not on file  Occupational History   Not on file  Tobacco Use   Smoking status: Never   Smokeless tobacco: Not on file  Vaping Use   Vaping Use: Never used  Substance and Sexual Activity   Alcohol use: Yes    Comment: occ   Drug use: No   Sexual activity: Yes    Birth control/protection: None  Other Topics Concern   Not on file  Social History Narrative   Not on file   Social Determinants of Health   Financial Resource Strain: Not on file  Food Insecurity: Not on file  Transportation Needs: Not on file  Physical Activity: Not on file  Stress: Not on file  Social Connections: Not on file  Intimate Partner Violence: Not on file   Family Status  Relation Name Status   Mother  Alive   Father  Alive   Family History  Problem Relation Age of Onset   Healthy Mother    Healthy Father    No Known Allergies  Patient Care Team: Carlean Jews, NP as PCP - General (Family Medicine) Maisie Fus, MD  as PCP - Cardiology (Cardiology)   Medications: No outpatient medications prior to visit.   No facility-administered medications prior to visit.    Review of Systems  {Labs (Optional):23779}   Objective    There were no vitals taken for this visit. BP Readings from Last 3 Encounters:  07/20/21 (!) 146/98  07/17/21 130/90  07/10/21 134/79    Wt Readings from Last 3 Encounters:  07/17/21 188 lb 3.2 oz (85.4 kg)  07/10/21 181 lb 4.8 oz (82.2 kg)  06/20/21 190 lb (86.2 kg)     Physical Exam  ***  Last depression screening scores    07/10/2021   10:29 AM  PHQ 2/9 Scores  PHQ - 2 Score 0  PHQ- 9 Score 0   Last fall risk screening     No data to display         Last Audit-C alcohol use screening     No data to display         A score of 3 or more in women, and 4 or more in men indicates increased risk for alcohol abuse, EXCEPT if all of the points are from question 1   No results found for any visits on 07/31/21.  Assessment & Plan  Routine Health Maintenance and Physical Exam  Exercise Activities and Dietary recommendations  Goals   None      There is no immunization history on file for this patient.  Health Maintenance  Topic Date Due   COVID-19 Vaccine (1) Never done   Hepatitis C Screening  Never done   TETANUS/TDAP  Never done   INFLUENZA VACCINE  08/26/2021   HIV Screening  Completed   HPV VACCINES  Aged Out    Discussed health benefits of physical activity, and encouraged him to engage in regular exercise appropriate for his age and condition.  Problem List Items Addressed This Visit   None    No follow-ups on file.        Carlean Jews, NP  [facility] at [facility] [phone] (phone) [phone] (fax)  [facility]

## 2021-07-31 ENCOUNTER — Ambulatory Visit (INDEPENDENT_AMBULATORY_CARE_PROVIDER_SITE_OTHER): Payer: 59 | Admitting: Nurse Practitioner

## 2021-07-31 ENCOUNTER — Encounter: Payer: Self-pay | Admitting: Nurse Practitioner

## 2021-07-31 VITALS — BP 131/79 | HR 81 | Ht 70.08 in | Wt 187.8 lb

## 2021-07-31 DIAGNOSIS — R079 Chest pain, unspecified: Secondary | ICD-10-CM | POA: Diagnosis not present

## 2021-07-31 DIAGNOSIS — Z23 Encounter for immunization: Secondary | ICD-10-CM | POA: Diagnosis not present

## 2021-07-31 DIAGNOSIS — Z0001 Encounter for general adult medical examination with abnormal findings: Secondary | ICD-10-CM | POA: Diagnosis not present

## 2021-07-31 DIAGNOSIS — I10 Essential (primary) hypertension: Secondary | ICD-10-CM

## 2021-07-31 DIAGNOSIS — R5383 Other fatigue: Secondary | ICD-10-CM | POA: Diagnosis not present

## 2021-07-31 DIAGNOSIS — Z0000 Encounter for general adult medical examination without abnormal findings: Secondary | ICD-10-CM

## 2021-07-31 MED ORDER — METOPROLOL SUCCINATE ER 25 MG PO TB24: 12.5000 mg | ORAL_TABLET | Freq: Every day | ORAL | 1 refills | Status: DC

## 2021-07-31 NOTE — Patient Instructions (Signed)
Start metoprolol ER  - 1/2 tablet daily  Limit intake of sodium Start low impact exercise daily Drink plenty of water Eat lots of healthy proteins  Eat plenty of green leafy vegetables.

## 2021-08-01 LAB — HEMOGLOBIN A1C
Est. average glucose Bld gHb Est-mCnc: 100 mg/dL
Hgb A1c MFr Bld: 5.1 % (ref 4.8–5.6)

## 2021-08-01 LAB — CBC
Hematocrit: 48.5 % (ref 37.5–51.0)
Hemoglobin: 16.2 g/dL (ref 13.0–17.7)
MCH: 29.6 pg (ref 26.6–33.0)
MCHC: 33.4 g/dL (ref 31.5–35.7)
MCV: 89 fL (ref 79–97)
Platelets: 203 10*3/uL (ref 150–450)
RBC: 5.48 x10E6/uL (ref 4.14–5.80)
RDW: 13.7 % (ref 11.6–15.4)
WBC: 5.1 10*3/uL (ref 3.4–10.8)

## 2021-08-01 LAB — COMPREHENSIVE METABOLIC PANEL
ALT: 29 IU/L (ref 0–44)
AST: 30 IU/L (ref 0–40)
Albumin/Globulin Ratio: 1.8 (ref 1.2–2.2)
Albumin: 4.7 g/dL (ref 4.0–5.0)
Alkaline Phosphatase: 61 IU/L (ref 44–121)
BUN/Creatinine Ratio: 11 (ref 9–20)
BUN: 14 mg/dL (ref 6–24)
Bilirubin Total: 0.8 mg/dL (ref 0.0–1.2)
CO2: 22 mmol/L (ref 20–29)
Calcium: 9.7 mg/dL (ref 8.7–10.2)
Chloride: 104 mmol/L (ref 96–106)
Creatinine, Ser: 1.25 mg/dL (ref 0.76–1.27)
Globulin, Total: 2.6 g/dL (ref 1.5–4.5)
Glucose: 89 mg/dL (ref 70–99)
Potassium: 4.4 mmol/L (ref 3.5–5.2)
Sodium: 141 mmol/L (ref 134–144)
Total Protein: 7.3 g/dL (ref 6.0–8.5)
eGFR: 75 mL/min/{1.73_m2} (ref >59)

## 2021-08-01 LAB — LIPID PANEL
Chol/HDL Ratio: 3.5 ratio (ref 0.0–5.0)
Cholesterol, Total: 201 mg/dL — ABNORMAL HIGH (ref 100–199)
HDL: 57 mg/dL (ref >39)
LDL Chol Calc (NIH): 123 mg/dL — ABNORMAL HIGH (ref 0–99)
Triglycerides: 116 mg/dL (ref 0–149)
VLDL Cholesterol Cal: 21 mg/dL (ref 5–40)

## 2021-08-01 LAB — MAGNESIUM: Magnesium: 2.1 mg/dL (ref 1.6–2.3)

## 2021-08-01 LAB — TSH+FREE T4
Free T4: 1.22 ng/dL (ref 0.82–1.77)
TSH: 1.08 u[IU]/mL (ref 0.450–4.500)

## 2021-08-01 LAB — PHOSPHORUS: Phosphorus: 2.7 mg/dL — ABNORMAL LOW (ref 2.8–4.1)

## 2021-08-07 ENCOUNTER — Telehealth: Payer: Self-pay

## 2021-08-07 NOTE — Telephone Encounter (Signed)
Attempted to call patient regarding message received from [employer] that states no information has been received in 2 days from heart monitor. Left message for patient to call back to office.

## 2021-08-12 NOTE — Progress Notes (Signed)
Reviewed with patient during recent visit

## 2021-08-13 ENCOUNTER — Other Ambulatory Visit: Payer: Self-pay

## 2021-08-13 DIAGNOSIS — R55 Syncope and collapse: Secondary | ICD-10-CM

## 2021-08-13 DIAGNOSIS — I517 Cardiomegaly: Secondary | ICD-10-CM

## 2021-08-13 NOTE — Telephone Encounter (Signed)
Spoke with patient, Patient removed monitor for recent Echo- he will place the monitor back on and call with any issues.

## 2021-08-14 NOTE — Addendum Note (Signed)
Addended by: Bea Laura B on: 08/14/2021 08:22 AM   Modules accepted: Orders

## 2021-08-18 ENCOUNTER — Emergency Department (HOSPITAL_COMMUNITY): Payer: 59

## 2021-08-18 ENCOUNTER — Encounter (HOSPITAL_COMMUNITY): Payer: Self-pay | Admitting: Emergency Medicine

## 2021-08-18 ENCOUNTER — Other Ambulatory Visit: Payer: Self-pay

## 2021-08-18 ENCOUNTER — Emergency Department (HOSPITAL_COMMUNITY)
Admission: EM | Admit: 2021-08-18 | Discharge: 2021-08-18 | Disposition: A | Discharge status: Home / Self Care | Payer: 59 | Attending: Emergency Medicine | Admitting: Emergency Medicine

## 2021-08-18 DIAGNOSIS — I10 Essential (primary) hypertension: Secondary | ICD-10-CM | POA: Diagnosis not present

## 2021-08-18 DIAGNOSIS — R0789 Other chest pain: Secondary | ICD-10-CM | POA: Insufficient documentation

## 2021-08-18 DIAGNOSIS — R42 Dizziness and giddiness: Secondary | ICD-10-CM | POA: Insufficient documentation

## 2021-08-18 DIAGNOSIS — Z79899 Other long term (current) drug therapy: Secondary | ICD-10-CM | POA: Insufficient documentation

## 2021-08-18 HISTORY — DX: Cardiomegaly: I51.7

## 2021-08-18 LAB — BASIC METABOLIC PANEL
Anion gap: 7 (ref 5–15)
BUN: 11 mg/dL (ref 6–20)
CO2: 26 mmol/L (ref 22–32)
Calcium: 9.1 mg/dL (ref 8.9–10.3)
Chloride: 105 mmol/L (ref 98–111)
Creatinine, Ser: 1.29 mg/dL — ABNORMAL HIGH (ref 0.61–1.24)
GFR, Estimated: >60 mL/min (ref >60)
Glucose, Bld: 112 mg/dL — ABNORMAL HIGH (ref 70–99)
Potassium: 4.2 mmol/L (ref 3.5–5.1)
Sodium: 138 mmol/L (ref 135–145)

## 2021-08-18 LAB — CBC
HCT: 47.9 % (ref 39.0–52.0)
Hemoglobin: 16 g/dL (ref 13.0–17.0)
MCH: 30 pg (ref 26.0–34.0)
MCHC: 33.4 g/dL (ref 30.0–36.0)
MCV: 89.9 fL (ref 80.0–100.0)
Platelets: 219 10*3/uL (ref 150–400)
RBC: 5.33 MIL/uL (ref 4.22–5.81)
RDW: 14.7 % (ref 11.5–15.5)
WBC: 4.8 10*3/uL (ref 4.0–10.5)
nRBC: 0 % (ref 0.0–0.2)

## 2021-08-18 LAB — PROTIME-INR
INR: 1 (ref 0.8–1.2)
Prothrombin Time: 13.5 seconds (ref 11.4–15.2)

## 2021-08-18 LAB — TROPONIN I (HIGH SENSITIVITY)
Troponin I (High Sensitivity): 4 ng/L (ref <18)
Troponin I (High Sensitivity): 5 ng/L (ref <18)

## 2021-08-18 MED ORDER — IOHEXOL 350 MG/ML SOLN
66.0000 mL | Freq: Once | INTRAVENOUS | Status: AC | PRN
  Administered 2021-08-18: 66 mL via INTRAVENOUS

## 2021-08-18 MED ORDER — LACTATED RINGERS IV BOLUS
1000.0000 mL | Freq: Once | INTRAVENOUS | Status: AC
  Administered 2021-08-18: 1000 mL via INTRAVENOUS

## 2021-08-18 MED ORDER — ASPIRIN 81 MG PO CHEW
324.0000 mg | CHEWABLE_TABLET | Freq: Once | ORAL | Status: AC
  Administered 2021-08-18: 324 mg via ORAL
  Filled 2021-08-18: qty 4

## 2021-08-18 NOTE — ED Triage Notes (Signed)
Patient reports persistent left upper chest pain  for 3 weeks with mild SOB , no emesis or diaphoresis . Denies cough or fever .

## 2021-08-18 NOTE — ED Provider Notes (Signed)
[facility] EMERGENCY DEPARTMENT Provider Note   CSN: [id number] Arrival date & time: 08/18/21  2706     History  Chief Complaint  Patient presents with   Chest Pain    Oscar Bates is a 41 y.o. male.   Chest Pain Patient presents for chest pain.  Medical history includes HTN.  He is followed by [facility].  He was seen by Dr. Wyline Mood last month.  He underwent cardiac testing earlier this month: Echocardiogram showed mild decreased LV function, hypokinesis, hypertrophy with apical trabeculations.  Valvular function was normal.  There was mild dilatation of aortic root.  Cardiac monitor showed no critical events over 2 weeks.  He has a cardiac MRI scheduled.  Although he has had a history of right-sided chest pressure over the past several weeks, patient reports a more severe chest pressure that is central in location that was present upon awakening this morning.  He denies any associated shortness of breath or pleuritic nature to his chest pain.  While he was driving to work, he experienced some lightheadedness with ongoing chest pressure.  For this reason, he presents to the ED.  Patient chest pressure has diminished in severity since onset.  He currently takes metoprolol for management of his hypertension.  He does not take a daily aspirin.     Home Medications Prior to Admission medications   Medication Sig Start Date End Date Taking? Authorizing Provider  metoprolol succinate (TOPROL-XL) 25 MG 24 hr tablet Take 0.5 tablets (12.5 mg total) by mouth daily. 07/31/21   Carlean Jews, NP      Allergies    Patient has no known allergies.    Review of Systems   Review of Systems  Respiratory:  Positive for chest tightness.   Cardiovascular:  Positive for chest pain.  Neurological:  Positive for light-headedness.  All other systems reviewed and are negative.   Physical Exam Updated Vital Signs BP (!) 158/104   Pulse 72   Temp 97.7 F (36.5 C)   Resp 16   SpO2  98%  Physical Exam Vitals and nursing note reviewed.  Constitutional:      General: He is not in acute distress.    Appearance: He is well-developed and normal weight. He is not ill-appearing, toxic-appearing or diaphoretic.  HENT:     Head: Normocephalic and atraumatic.  Eyes:     Conjunctiva/sclera: Conjunctivae normal.  Neck:     Vascular: No JVD.  Cardiovascular:     Rate and Rhythm: Normal rate and regular rhythm.     Heart sounds: No murmur heard. Pulmonary:     Effort: Pulmonary effort is normal. No respiratory distress.     Breath sounds: Normal breath sounds. No decreased breath sounds, wheezing, rhonchi or rales.  Chest:     Chest wall: No tenderness.  Abdominal:     Palpations: Abdomen is soft.     Tenderness: There is no abdominal tenderness.  Musculoskeletal:        General: No swelling. Normal range of motion.     Cervical back: Normal range of motion and neck supple.     Right lower leg: No edema.     Left lower leg: No edema.  Skin:    General: Skin is warm and dry.     Coloration: Skin is not cyanotic or pale.  Neurological:     General: No focal deficit present.     Mental Status: He is alert and oriented to person, place, and  time.     Cranial Nerves: No cranial nerve deficit.     Motor: No weakness.  Psychiatric:        Mood and Affect: Mood normal.        Behavior: Behavior normal.     ED Results / Procedures / Treatments   Labs (all labs ordered are listed, but only abnormal results are displayed) Labs Reviewed  BASIC METABOLIC PANEL - Abnormal; Notable for the following components:      Result Value   Glucose, Bld 112 (*)    Creatinine, Ser 1.29 (*)    All other components within normal limits  CBC  PROTIME-INR  TROPONIN I (HIGH SENSITIVITY)  TROPONIN I (HIGH SENSITIVITY)    EKG EKG Interpretation  Date/Time:  Monday August 18 2021 06:44:58 EDT Ventricular Rate:  75 PR Interval:  172 QRS Duration: 88 QT Interval:  346 QTC  Calculation: 386 R Axis:   82 Text Interpretation: Normal sinus rhythm Normal ECG When compared with ECG of 27-Jun-2021 13:01, No significant change was found Confirmed by Dione Booze ([id number]) on 08/18/2021 6:47:14 AM  Radiology CT Angio Chest PE W and/or Wo Contrast  Result Date: 08/18/2021 CLINICAL DATA:  Chest pain EXAM: CT ANGIOGRAPHY CHEST WITH CONTRAST TECHNIQUE: Multidetector CT imaging of the chest was performed using the standard protocol during bolus administration of intravenous contrast. Multiplanar CT image reconstructions and MIPs were obtained to evaluate the vascular anatomy. RADIATION DOSE REDUCTION: This exam was performed according to the departmental dose-optimization program which includes automated exposure control, adjustment of the mA and/or kV according to patient size and/or use of iterative reconstruction technique. CONTRAST:  87mL OMNIPAQUE IOHEXOL 350 MG/ML SOLN COMPARISON:  Chest radiograph earlier on the same date FINDINGS: Cardiovascular: Satisfactory opacification of the pulmonary arteries to the segmental level. No evidence of pulmonary embolism. Normal heart size. No pericardial effusion. Mediastinum/Nodes: No enlarged mediastinal, hilar, or axillary lymph nodes. Thyroid gland, trachea, and esophagus demonstrate no significant findings. Lungs/Pleura: Lungs are clear. No pleural effusion or pneumothorax. Upper Abdomen: No acute abnormality. Musculoskeletal: No chest wall abnormality. No acute or significant osseous findings. Review of the MIP images confirms the above findings. IMPRESSION: 1.  No evidence of pulmonary embolism. 2.  Lungs are clear.  No acute pulmonary process. Electronically Signed   By: Larose Hires D.O.   On: 08/18/2021 09:37   DG Chest 2 View  Result Date: 08/18/2021 CLINICAL DATA:  Chest pain for 3 weeks EXAM: CHEST - 2 VIEW COMPARISON:  06/20/2021 FINDINGS: Normal heart size and mediastinal contours. No acute infiltrate or edema. No effusion or  pneumothorax. No acute osseous findings. IMPRESSION: No active cardiopulmonary disease. Electronically Signed   By: Tiburcio Pea M.D.   On: 08/18/2021 07:18    Procedures Procedures    Medications Ordered in ED Medications  aspirin chewable tablet 324 mg (324 mg Oral Given 08/18/21 0818)  lactated ringers bolus 1,000 mL (1,000 mLs Intravenous New Bag/Given 08/18/21 0904)  iohexol (OMNIPAQUE) 350 MG/ML injection 66 mL (66 mLs Intravenous Contrast Given 08/18/21 6045)    ED Course/ Medical Decision Making/ A&P                           Medical Decision Making Amount and/or Complexity of Data Reviewed Labs: ordered. Radiology: ordered.  Risk OTC drugs. Prescription drug management.   This patient presents to the ED for concern of chest pressure, this involves an extensive number of treatment options, and  is a complaint that carries with it a high risk of complications and morbidity.  The differential diagnosis includes ACS, pericarditis, PE, reactive airway disease, anxiety   Co morbidities that complicate the patient evaluation  HTN   Additional history obtained:  Additional history obtained from N/A External records from outside source obtained and reviewed including EMR   Lab Tests:  I Ordered, and personally interpreted labs.  The pertinent results include: Normal hemoglobin, no leukocytosis, normal electrolytes, normal troponins x2   Imaging Studies ordered:  I ordered imaging studies including chest x-ray, CTA chest I independently visualized and interpreted imaging which showed no acute findings I agree with the radiologist interpretation   Cardiac Monitoring: / EKG:  The patient was maintained on a cardiac monitor.  I personally viewed and interpreted the cardiac monitored which showed an underlying rhythm of: Sinus rhythm  Problem List / ED Course / Critical interventions / Medication management  Patient is a healthy 41 year old male presenting for  chest pressure.  He has recently been worked up by cardiology.  He has a cardiac MRI scheduled for October.  This morning he awoke with increased severity chest pressure, central in location.  Discomfort did not radiate.  He did not have associated shortness of breath, diaphoresis, or nausea.  He did experience some lightheadedness while driving to work.  On arrival in the ED, he endorses some very mild residual chest pressure but denies any other symptoms.  He is well-appearing on exam.  No cardiac murmurs are appreciated on lung auscultation.  His breathing is unlabored.  His vital signs are notable for moderate hypertension.  He is on low-dose metoprolol daily for his hypertension.  EKG shows no ST segment changes and no dynamic changes from prior EKGs.  Laboratory work-up was initiated.  Patient was given ASA.  Patient had normal troponins x2.  CTA of chest did not show any acute cardiopulmonary findings.  He had no worsening of symptoms while in the ED.  He was advised to continue to follow-up with cardiology for ongoing management of his blood pressure as well as further testing.  He is stable for discharge at this time. I ordered medication including ASA for concern of ACS; IVF for hydration Reevaluation of the patient after these medicines showed that the patient improved I have reviewed the patients home medicines and have made adjustments as needed   Social Determinants of Health:  Has access to outpatient care, including cardiology         Final Clinical Impression(s) / ED Diagnoses Final diagnoses:  Chest tightness    Rx / DC Orders ED Discharge Orders     None         Gloris Manchester, MD 08/18/21 1050

## 2021-08-18 NOTE — Discharge Instructions (Signed)
Your heart enzymes and CT scan of your chest are normal today.  Continue to follow-up with your cardiologist for management of blood pressure medications and further testing.  Return to the emergency department at any time for any new or worsening symptoms.

## 2021-08-28 ENCOUNTER — Ambulatory Visit: Payer: 59 | Admitting: Nurse Practitioner

## 2021-09-03 ENCOUNTER — Ambulatory Visit: Payer: 59 | Admitting: Nurse Practitioner

## 2021-09-11 ENCOUNTER — Ambulatory Visit: Payer: 59 | Admitting: Nurse Practitioner

## 2021-10-21 ENCOUNTER — Telehealth: Payer: Self-pay | Admitting: Internal Medicine

## 2021-10-21 ENCOUNTER — Ambulatory Visit: Payer: 59 | Admitting: Internal Medicine

## 2021-10-21 NOTE — Telephone Encounter (Signed)
Left message for patient to reschedule 10/05 appointment for after 10/11 MRI.

## 2021-10-30 ENCOUNTER — Ambulatory Visit: Payer: 59 | Attending: Internal Medicine | Admitting: Internal Medicine

## 2021-10-30 NOTE — Progress Notes (Deleted)
Cardiology Office Note:    Date:  10/30/2021   ID:  Oscar Bates, DOB 11-10-1980, MRN [id number]  PCP:  Ronnell Freshwater, NP   [facility] Providers Cardiologist:  Janina Mayo, MD     Referring MD: Ronnell Freshwater, NP   No chief complaint on file. Syncope  History of Present Illness:    Oscar Bates is a 41 y.o. male ED referral for repeated syncopal episodes. Also described R sided chest wall pain. EKG was normal. Normal Qtc. He notes he was walking at work and then he passed out. He had no prodrome. He was walking from is car to another location and the next thing he knows, he was surrounded by his coworkers. He went to the ED and wait time was too long, so he left. Later, he was taking a shower and felt himself getting dizzy and presyncopal. His wife encouraged him to go to the ED. EKG was normal. No real conclusion in the ED. He also notes intermittent sharp chest pains on the R side. He takes tylenol and it that helps. No cardiac family hx. No sudden CD in family. This has never happened before. No new medicines, no dehydration prior.  He denies dizziness or LH. He is otherwise healthy    Past Surgical History:  Procedure Laterality Date   CIRCUMCISION     HERNIA REPAIR     KNEE SURGERY     NASAL FRACTURE SURGERY      Current Medications: No outpatient medications have been marked as taking for the 10/30/21 encounter (Appointment) with Janina Mayo, MD.     Allergies:   Patient has no known allergies.   Social History   Socioeconomic History   Marital status: Single    Spouse name: Not on file   Number of children: Not on file   Years of education: Not on file   Highest education level: Not on file  Occupational History   Not on file  Tobacco Use   Smoking status: Never   Smokeless tobacco: Not on file  Vaping Use   Vaping Use: Never used  Substance and Sexual Activity   Alcohol use: Yes    Comment: occ   Drug use: No   Sexual activity: Yes     Birth control/protection: None  Other Topics Concern   Not on file  Social History Narrative   Not on file   Social Determinants of Health   Financial Resource Strain: Not on file  Food Insecurity: Not on file  Transportation Needs: Not on file  Physical Activity: Not on file  Stress: Not on file  Social Connections: Not on file     Family History: The patient's family history includes Healthy in his father and mother.  ROS:   Please see the history of present illness.     All other systems reviewed and are negative.  EKGs/Labs/Other Studies Reviewed:    The following studies were reviewed today:   EKG:  none today   Recent Labs: 07/31/2021: ALT 29; Magnesium 2.1; TSH 1.080 08/18/2021: BUN 11; Creatinine, Ser 1.29; Hemoglobin 16.0; Platelets 219; Potassium 4.2; Sodium 138   Recent Lipid Panel    Component Value Date/Time   CHOL 201 (H) 07/31/2021 0952   TRIG 116 07/31/2021 0952   HDL 57 07/31/2021 0952   CHOLHDL 3.5 07/31/2021 0952   LDLCALC 123 (H) 07/31/2021 0952     Risk Assessment/Calculations:           Physical  Exam:    VS:    There were no vitals filed for this visit.    Wt Readings from Last 3 Encounters:  07/31/21 187 lb 12.8 oz (85.2 kg)  07/17/21 188 lb 3.2 oz (85.4 kg)  07/10/21 181 lb 4.8 oz (82.2 kg)     GEN:  Well nourished, well developed in no acute distress HEENT: Normal NECK: No JVD; No carotid bruits LYMPHATICS: No lymphadenopathy CARDIAC: RRR, no murmurs, rubs, gallops RESPIRATORY:  Clear to auscultation without rales, wheezing or rhonchi  ABDOMEN: Soft, non-tender, non-distended MUSCULOSKELETAL:  No edema; No deformity  SKIN: Warm and dry NEUROLOGIC:  Alert and oriented x 3 PSYCHIATRIC:  Normal affect   ASSESSMENT:    Syncope: With sudden loss of consciousness and no prodrome, more concerning for arrhythmia. Did a preventiss which was normal. Echo showed trabeculations with c/f noncompaction. Ordered him a cardiac MRI,  which he did not complete ***  PLAN:    In order of problems listed above:             Medication Adjustments/Labs and Tests Ordered: Current medicines are reviewed at length with the patient today.  Concerns regarding medicines are outlined above.  No orders of the defined types were placed in this encounter.  No orders of the defined types were placed in this encounter.   There are no Patient Instructions on file for this visit.   Signed, Janina Mayo, MD  10/30/2021 3:30 PM    [place]

## 2021-11-04 ENCOUNTER — Telehealth (HOSPITAL_COMMUNITY): Payer: Self-pay | Admitting: Emergency Medicine

## 2021-11-04 NOTE — Telephone Encounter (Signed)
Attempted to call patient regarding upcoming cardiac MR appointment. Left message on voicemail with name and callback number Fares Ramthun RN Navigator Cardiac Imaging [facility] [phone] Office [phone] Cell  

## 2021-11-05 ENCOUNTER — Ambulatory Visit (HOSPITAL_COMMUNITY)
Admission: RE | Admit: 2021-11-05 | Discharge: 2021-11-05 | Disposition: A | Discharge status: Home / Self Care | Payer: 59 | Source: Ambulatory Visit | Attending: Internal Medicine | Admitting: Internal Medicine

## 2021-11-05 ENCOUNTER — Other Ambulatory Visit: Payer: Self-pay | Admitting: Internal Medicine

## 2021-11-05 DIAGNOSIS — I517 Cardiomegaly: Secondary | ICD-10-CM

## 2021-11-05 DIAGNOSIS — R55 Syncope and collapse: Secondary | ICD-10-CM

## 2021-11-05 MED ORDER — GADOBUTROL 1 MMOL/ML IV SOLN
10.0000 mL | Freq: Once | INTRAVENOUS | Status: AC | PRN
  Administered 2021-11-05: 10 mL via INTRAVENOUS

## 2021-11-23 ENCOUNTER — Ambulatory Visit
Admission: EM | Admit: 2021-11-23 | Discharge: 2021-11-23 | Disposition: A | Discharge status: Home / Self Care | Payer: 59 | Attending: Emergency Medicine | Admitting: Emergency Medicine

## 2021-11-23 ENCOUNTER — Other Ambulatory Visit: Payer: Self-pay

## 2021-11-23 ENCOUNTER — Encounter: Payer: Self-pay | Admitting: Emergency Medicine

## 2021-11-23 DIAGNOSIS — J329 Chronic sinusitis, unspecified: Secondary | ICD-10-CM

## 2021-11-23 MED ORDER — METHYLPREDNISOLONE 8 MG PO TABS: 16.0000 mg | ORAL_TABLET | Freq: Every day | ORAL | 0 refills | Status: AC

## 2021-11-23 MED ORDER — FLUTICASONE PROPIONATE 50 MCG/ACT NA SUSP: 1.0000 | Freq: Every day | NASAL | 2 refills | Status: DC

## 2021-11-23 MED ORDER — METHYLPREDNISOLONE SODIUM SUCC 125 MG IJ SOLR
125.0000 mg | Freq: Once | INTRAMUSCULAR | Status: AC
  Administered 2021-11-23: 125 mg via INTRAMUSCULAR

## 2021-11-23 MED ORDER — IPRATROPIUM BROMIDE 0.06 % NA SOLN: 2.0000 | Freq: Three times a day (TID) | NASAL | 1 refills | Status: DC

## 2021-11-23 MED ORDER — CETIRIZINE HCL 10 MG PO TABS: 10.0000 mg | ORAL_TABLET | Freq: Every day | ORAL | 2 refills | Status: DC

## 2021-11-23 NOTE — ED Triage Notes (Signed)
Pt here for nasal congestion and ear pain x 10 days

## 2021-11-23 NOTE — Discharge Instructions (Signed)
Based on my physical exam findings, believe that you are suffering from acute inflammation of your upper respiratory tract, also called rhinosinusitis.  The goal of treatment is to reduce inflammation.  Please see the list below for recommended medications, dosages and frequencies to provide relief of current symptoms:     Solu-Medrol IM (methylprednisolone):  To quickly address your significant respiratory inflammation, you were provided with an injection of Solu-Medrol in the office today.  You should continue to feel the full benefit of the steroid for the next 4 to 6 hours.    Medrol (methylprednisolone): This is a steroid that will significantly calm your upper and lower airways, please take the daily recommended quantity of tablets daily with your breakfast meal starting tomorrow morning until the prescription is complete.      Zyrtec (cetirizine): This is an excellent second-generation antihistamine that helps to reduce respiratory inflammatory response to environmental allergens.  In some patients, this medication can cause daytime sleepiness so I recommend that you take 1 tablet daily at bedtime.     Flonase (fluticasone): This is a steroid nasal spray that you use once daily, 1 spray in each nare.  This medication does not work well if you decide to use it only used as you feel you need to, it works best used on a daily basis.  After 3 to 5 days of use, you will notice significant reduction of the inflammation and mucus production that is currently being caused by exposure to allergens, whether seasonal or environmental.  The most common side effect of this medication is nosebleeds.  If you experience a nosebleed, please discontinue use for 1 week, then feel free to resume.  I have provided you with a prescription.     Atrovent (ipratropium): This is an excellent nasal decongestant spray I have added to your recommended nasal steroid that will not cause rebound congestion, please instill 2  sprays into each nare with each use.  Because nasal steroids can take several days before they begin to provide full benefit, I recommend that you use this spray in addition to the nasal steroid prescribed for you.  Please use it after you have used your nasal steroid and repeat up to 4 times daily as needed.  I have provided you with a prescription for this medication.      If you find that you have not had improvement of your symptoms in the next 3 to 5 days, please follow-up with your primary care provider or return here to urgent care for repeat evaluation and further recommendations.  If you find that your symptoms have become worse or you notice that there are drainage coming from your sinuses has become thick and green, please return for repeat evaluation.  You may require antibiotics.  If you find that your health insurance will not pay for allergy medications, please consider downloading the GoodRx app and using to get a better price than the "off the shelf" price.        Thank you for visiting urgent care today.  We appreciate the opportunity to participate in your care.

## 2021-11-23 NOTE — ED Provider Notes (Signed)
[facility]    CSN: [id number] Arrival date & time: 11/23/21  7619    HISTORY   Chief Complaint  Patient presents with   Nasal Congestion   HPI Oscar Bates is a pleasant, 41 y.o. male who presents to urgent care today. Patient reports that 10-day history of nasal congestion and ear pain.  Patient states he has a history of allergies and asthma, not currently taking any medication for either issue.  Patient denies headache, sore throat, fever, nausea, vomiting, diarrhea, dizziness, sinus pressure/pain, known sick contacts.  Patient has elevated blood pressure on arrival but vital signs are otherwise normal.  The history is provided by the patient.   Past Medical History:  Diagnosis Date   LVH (left ventricular hypertrophy)    Patient Active Problem List   Diagnosis Date Noted   Essential hypertension 07/31/2021   Other fatigue 07/31/2021   Right-sided chest pain 07/11/2021   Past Surgical History:  Procedure Laterality Date   CIRCUMCISION     HERNIA REPAIR     KNEE SURGERY     NASAL FRACTURE SURGERY      Home Medications    Prior to Admission medications   Medication Sig Start Date End Date Taking? Authorizing Provider  cetirizine (ZYRTEC ALLERGY) 10 MG tablet Take 1 tablet (10 mg total) by mouth at bedtime. 11/23/21 02/21/22 Yes Theadora Rama Scales, PA-C  fluticasone (FLONASE) 50 MCG/ACT nasal spray Place 1 spray into both nostrils daily. Begin by using 2 sprays in each nare daily for 3 to 5 days, then decrease to 1 spray in each nare daily. 11/23/21  Yes Theadora Rama Scales, PA-C  ipratropium (ATROVENT) 0.06 % nasal spray Place 2 sprays into both nostrils 3 (three) times daily. As needed for nasal congestion, runny nose 11/23/21  Yes Theadora Rama Scales, PA-C  methylPREDNISolone (MEDROL) 8 MG tablet Take 2 tablets (16 mg total) by mouth daily for 3 days. 11/23/21 11/26/21 Yes Theadora Rama Scales, PA-C  metoprolol succinate (TOPROL-XL) 25 MG 24  hr tablet Take 0.5 tablets (12.5 mg total) by mouth daily. 07/31/21   Carlean Jews, NP    Family History Family History  Problem Relation Age of Onset   Healthy Mother    Healthy Father    Social History Social History   Tobacco Use   Smoking status: Never  Vaping Use   Vaping Use: Never used  Substance Use Topics   Alcohol use: Yes    Comment: occ   Drug use: No   Allergies   Patient has no known allergies.  Review of Systems Review of Systems Pertinent findings revealed after performing a 14 point review of systems has been noted in the history of present illness.  Physical Exam Triage Vital Signs ED Triage Vitals  Enc Vitals Group     BP 11/22/20 0827 (!) 147/82     Pulse Rate 11/22/20 0827 72     Resp 11/22/20 0827 18     Temp 11/22/20 0827 98.3 F (36.8 C)     Temp Source 11/22/20 0827 Oral     SpO2 11/22/20 0827 98 %     Weight --      Height --      Head Circumference --      Peak Flow --      Pain Score 11/22/20 0826 5     Pain Loc --      Pain Edu? --      Excl. in GC? --  No data found.  Updated Vital Signs BP (!) 140/90 (BP Location: Left Arm)   Pulse 81   Temp 98 F (36.7 C) (Oral)   Resp 18   SpO2 95%   Physical Exam Vitals and nursing note reviewed.  Constitutional:      General: He is not in acute distress.    Appearance: Normal appearance. He is not ill-appearing.  HENT:     Head: Normocephalic and atraumatic.     Salivary Glands: Right salivary gland is not diffusely enlarged or tender. Left salivary gland is not diffusely enlarged or tender.     Right Ear: Ear canal and external ear normal. No drainage. A middle ear effusion is present. There is no impacted cerumen. Tympanic membrane is bulging. Tympanic membrane is not injected or erythematous.     Left Ear: Ear canal and external ear normal. No drainage. A middle ear effusion is present. There is no impacted cerumen. Tympanic membrane is bulging. Tympanic membrane is not  injected or erythematous.     Ears:     Comments: Bilateral EACs normal, both TMs bulging with clear fluid    Nose: Rhinorrhea present. No nasal deformity, septal deviation, signs of injury, nasal tenderness, mucosal edema or congestion. Rhinorrhea is clear.     Right Nostril: Occlusion present. No foreign body, epistaxis or septal hematoma.     Left Nostril: Occlusion present. No foreign body, epistaxis or septal hematoma.     Right Turbinates: Enlarged, swollen and pale.     Left Turbinates: Enlarged, swollen and pale.     Right Sinus: No maxillary sinus tenderness or frontal sinus tenderness.     Left Sinus: No maxillary sinus tenderness or frontal sinus tenderness.     Mouth/Throat:     Lips: Pink. No lesions.     Mouth: Mucous membranes are moist. No oral lesions.     Pharynx: Oropharynx is clear. Uvula midline. No posterior oropharyngeal erythema or uvula swelling.     Tonsils: No tonsillar exudate. 0 on the right. 0 on the left.     Comments: Postnasal drip Eyes:     General: Lids are normal.        Right eye: No discharge.        Left eye: No discharge.     Extraocular Movements: Extraocular movements intact.     Conjunctiva/sclera: Conjunctivae normal.     Right eye: Right conjunctiva is not injected.     Left eye: Left conjunctiva is not injected.  Neck:     Trachea: Trachea and phonation normal.  Cardiovascular:     Rate and Rhythm: Normal rate and regular rhythm.     Pulses: Normal pulses.     Heart sounds: Normal heart sounds. No murmur heard.    No friction rub. No gallop.  Pulmonary:     Effort: Pulmonary effort is normal. No accessory muscle usage, prolonged expiration or respiratory distress.     Breath sounds: Normal breath sounds. No stridor, decreased air movement or transmitted upper airway sounds. No decreased breath sounds, wheezing, rhonchi or rales.  Chest:     Chest wall: No tenderness.  Musculoskeletal:        General: Normal range of motion.      Cervical back: Normal range of motion and neck supple. Normal range of motion.  Lymphadenopathy:     Cervical: No cervical adenopathy.  Skin:    General: Skin is warm and dry.     Findings: No erythema or rash.  Neurological:     General: No focal deficit present.     Mental Status: He is alert and oriented to person, place, and time.  Psychiatric:        Mood and Affect: Mood normal.        Behavior: Behavior normal.     Visual Acuity Right Eye Distance:   Left Eye Distance:   Bilateral Distance:    Right Eye Near:   Left Eye Near:    Bilateral Near:     UC Couse / Diagnostics / Procedures:     Radiology No results found.  Procedures Procedures (including critical care time) EKG  Pending results:  Labs Reviewed - No data to display  Medications Ordered in UC: Medications  methylPREDNISolone sodium succinate (SOLU-MEDROL) 125 mg/2 mL injection 125 mg (has no administration in time range)    UC Diagnoses / Final Clinical Impressions(s)   I have reviewed the triage vital signs and the nursing notes.  Pertinent labs & imaging results that were available during my care of the patient were reviewed by me and considered in my medical decision making (see chart for details).    Final diagnoses:  Rhinosinusitis   Patient denies history of allergies however physical exam findings are concerning for allergic rhinitis.  Recommend that patient begin a short course of steroids for rapid relief of his acute allergies and begin Zyrtec and Flonase daily.  Ipratropium nasal spray also provided to help resolve postnasal drip and rhinitis more quickly while waiting for Flonase to take full effect.  Viral testing not indicated due to duration of symptoms.  I did not appreciate any signs of bacterial infection on physical exam.  Return precautions advised.  ED Prescriptions     Medication Sig Dispense Auth. Provider   methylPREDNISolone (MEDROL) 8 MG tablet Take 2 tablets (16 mg  total) by mouth daily for 3 days. 6 tablet Lynden Oxford Scales, PA-C   cetirizine (ZYRTEC ALLERGY) 10 MG tablet Take 1 tablet (10 mg total) by mouth at bedtime. 30 tablet Lynden Oxford Scales, PA-C   fluticasone (FLONASE) 50 MCG/ACT nasal spray Place 1 spray into both nostrils daily. Begin by using 2 sprays in each nare daily for 3 to 5 days, then decrease to 1 spray in each nare daily. 15.8 mL Lynden Oxford Scales, PA-C   ipratropium (ATROVENT) 0.06 % nasal spray Place 2 sprays into both nostrils 3 (three) times daily. As needed for nasal congestion, runny nose 15 mL Lynden Oxford Scales, PA-C      PDMP not reviewed this encounter.  Disposition Upon Discharge:  Condition: stable for discharge home Home: take medications as prescribed; routine discharge instructions as discussed; follow up as advised.  Patient presented with an acute illness with associated systemic symptoms and significant discomfort requiring urgent management. In my opinion, this is a condition that a prudent lay person (someone who possesses an average knowledge of health and medicine) may potentially expect to result in complications if not addressed urgently such as respiratory distress, impairment of bodily function or dysfunction of bodily organs.   Routine symptom specific, illness specific and/or disease specific instructions were discussed with the patient and/or caregiver at length.   As such, the patient has been evaluated and assessed, work-up was performed and treatment was provided in alignment with urgent care protocols and evidence based medicine.  Patient/parent/caregiver has been advised that the patient may require follow up for further testing and treatment if the symptoms continue in spite of treatment, as  clinically indicated and appropriate.  If the patient was tested for COVID-19, Influenza and/or RSV, then the patient/parent/guardian was advised to isolate at home pending the results of his/her  diagnostic coronavirus test and potentially longer if they're positive. I have also advised pt that if his/her COVID-19 test returns positive, it's recommended to self-isolate for at least 10 days after symptoms first appeared AND until fever-free for 24 hours without fever reducer AND other symptoms have improved or resolved. Discussed self-isolation recommendations as well as instructions for household member/close contacts as per the [facility] and [place] DHHS, and also gave patient the COVID packet with this information.  Patient/parent/caregiver has been advised to return to the [facility] or PCP in 3-5 days if no better; to PCP or the Emergency Department if new signs and symptoms develop, or if the current signs or symptoms continue to change or worsen for further workup, evaluation and treatment as clinically indicated and appropriate  The patient will follow up with their current PCP if and as advised. If the patient does not currently have a PCP we will assist them in obtaining one.   The patient may need specialty follow up if the symptoms continue, in spite of conservative treatment and management, for further workup, evaluation, consultation and treatment as clinically indicated and appropriate.  Patient/parent/caregiver verbalized understanding and agreement of plan as discussed.  All questions were addressed during visit.  Please see discharge instructions below for further details of plan.  Discharge Instructions:   Discharge Instructions      Based on my physical exam findings, believe that you are suffering from acute inflammation of your upper respiratory tract, also called rhinosinusitis.  The goal of treatment is to reduce inflammation.  Please see the list below for recommended medications, dosages and frequencies to provide relief of current symptoms:     Solu-Medrol IM (methylprednisolone):  To quickly address your significant respiratory inflammation, you were provided with an injection of  Solu-Medrol in the office today.  You should continue to feel the full benefit of the steroid for the next 4 to 6 hours.    Medrol (methylprednisolone): This is a steroid that will significantly calm your upper and lower airways, please take the daily recommended quantity of tablets daily with your breakfast meal starting tomorrow morning until the prescription is complete.      Zyrtec (cetirizine): This is an excellent second-generation antihistamine that helps to reduce respiratory inflammatory response to environmental allergens.  In some patients, this medication can cause daytime sleepiness so I recommend that you take 1 tablet daily at bedtime.     Flonase (fluticasone): This is a steroid nasal spray that you use once daily, 1 spray in each nare.  This medication does not work well if you decide to use it only used as you feel you need to, it works best used on a daily basis.  After 3 to 5 days of use, you will notice significant reduction of the inflammation and mucus production that is currently being caused by exposure to allergens, whether seasonal or environmental.  The most common side effect of this medication is nosebleeds.  If you experience a nosebleed, please discontinue use for 1 week, then feel free to resume.  I have provided you with a prescription.     Atrovent (ipratropium): This is an excellent nasal decongestant spray I have added to your recommended nasal steroid that will not cause rebound congestion, please instill 2 sprays into each nare with each use.  Because  nasal steroids can take several days before they begin to provide full benefit, I recommend that you use this spray in addition to the nasal steroid prescribed for you.  Please use it after you have used your nasal steroid and repeat up to 4 times daily as needed.  I have provided you with a prescription for this medication.      If you find that you have not had improvement of your symptoms in the next 3 to 5 days,  please follow-up with your primary care provider or return here to urgent care for repeat evaluation and further recommendations.  If you find that your symptoms have become worse or you notice that there are drainage coming from your sinuses has become thick and green, please return for repeat evaluation.  You may require antibiotics.  If you find that your health insurance will not pay for allergy medications, please consider downloading the GoodRx app and using to get a better price than the "off the shelf" price.        Thank you for visiting urgent care today.  We appreciate the opportunity to participate in your care.       This office note has been dictated using Teaching laboratory technicianDragon speech recognition software.  Unfortunately, this method of dictation can sometimes lead to typographical or grammatical errors.  I apologize for your inconvenience in advance if this occurs.  Please do not hesitate to reach out to me if clarification is needed.      Theadora RamaMorgan, Shaleta Ruacho Scales, PA-C 11/24/21 [phone]

## 2022-02-17 ENCOUNTER — Other Ambulatory Visit: Payer: Self-pay | Admitting: Nurse Practitioner

## 2022-02-17 DIAGNOSIS — I10 Essential (primary) hypertension: Secondary | ICD-10-CM

## 2022-04-22 ENCOUNTER — Encounter (HOSPITAL_COMMUNITY): Payer: Self-pay

## 2022-04-22 ENCOUNTER — Ambulatory Visit (HOSPITAL_COMMUNITY)
Admission: EM | Admit: 2022-04-22 | Discharge: 2022-04-22 | Disposition: A | Discharge status: Home / Self Care | Payer: Medicaid Other | Attending: Emergency Medicine | Admitting: Emergency Medicine

## 2022-04-22 DIAGNOSIS — M545 Low back pain, unspecified: Secondary | ICD-10-CM

## 2022-04-22 DIAGNOSIS — R319 Hematuria, unspecified: Secondary | ICD-10-CM

## 2022-04-22 DIAGNOSIS — H10021 Other mucopurulent conjunctivitis, right eye: Secondary | ICD-10-CM

## 2022-04-22 DIAGNOSIS — S161XXA Strain of muscle, fascia and tendon at neck level, initial encounter: Secondary | ICD-10-CM

## 2022-04-22 LAB — POCT URINALYSIS DIPSTICK, ED / UC
Bilirubin Urine: NEGATIVE
Glucose, UA: NEGATIVE mg/dL
Ketones, ur: NEGATIVE mg/dL
Leukocytes,Ua: NEGATIVE
Nitrite: NEGATIVE
Protein, ur: NEGATIVE mg/dL
Specific Gravity, Urine: 1.02 (ref 1.005–1.030)
Urobilinogen, UA: 0.2 mg/dL (ref 0.0–1.0)
pH: 7 (ref 5.0–8.0)

## 2022-04-22 MED ORDER — ERYTHROMYCIN 5 MG/GM OP OINT: TOPICAL_OINTMENT | OPHTHALMIC | 0 refills | Status: DC

## 2022-04-22 MED ORDER — METHOCARBAMOL 500 MG PO TABS: 500.0000 mg | ORAL_TABLET | Freq: Two times a day (BID) | ORAL | 0 refills | Status: DC
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
## 2022-05-06 ENCOUNTER — Ambulatory Visit: Payer: Medicaid Other | Attending: Internal Medicine | Admitting: Internal Medicine

## 2022-05-06 NOTE — Progress Notes (Deleted)
Cardiology Office Note:    Date:  05/06/2022   ID:  Oscar Bates, DOB 16-Nov-1980, MRN [id number]  PCP:  Carlean Jews, NP   [facility] Providers Cardiologist:  Maisie Fus, MD     Referring MD: Carlean Jews, NP   No chief complaint on file. Syncope  History of Present Illness:    Oscar Bates is a 42 y.o. male ED referral for repeated syncopal episodes. Also described R sided chest wall pain. EKG was normal. Normal Qtc. He notes he was walking at work and then he passed out. He had no prodrome. He was walking from is car to another location and the next thing he knows, he was surrounded by his coworkers. He went to the ED and wait time was too long, so he left. Later, he was taking a shower and felt himself getting dizzy and presyncopal. His wife encouraged him to go to the ED. EKG was normal. No real conclusion in the ED. He also notes intermittent sharp chest pains on the R side. He takes tylenol and it that helps. No cardiac family hx. No sudden CD in family. This has never happened before. No new medicines, no dehydration prior.  He denies dizziness or LH. He is otherwise healthy  Interim Hx   Cardiology Studies: TTE: EF 50% with apical trabeculations with c/f non compaction.  No other significant abnormalities.   CMR: Trabeculated apex does not meet criteria for non compaction    Past Surgical History:  Procedure Laterality Date   CIRCUMCISION     HERNIA REPAIR     KNEE SURGERY     NASAL FRACTURE SURGERY      Current Medications: No outpatient medications have been marked as taking for the 05/06/22 encounter (Appointment) with Maisie Fus, MD.     Allergies:   Patient has no known allergies.   Social History   Socioeconomic History   Marital status: Single    Spouse name: Not on file   Number of children: Not on file   Years of education: Not on file   Highest education level: Not on file  Occupational History   Not on file  Tobacco Use    Smoking status: Never   Smokeless tobacco: Not on file  Vaping Use   Vaping Use: Never used  Substance and Sexual Activity   Alcohol use: Yes    Comment: occ   Drug use: No   Sexual activity: Yes    Birth control/protection: None  Other Topics Concern   Not on file  Social History Narrative   Not on file   Social Determinants of Health   Financial Resource Strain: Not on file  Food Insecurity: Not on file  Transportation Needs: Not on file  Physical Activity: Not on file  Stress: Not on file  Social Connections: Not on file     Family History: The patient's family history includes Healthy in his father and mother.  ROS:   Please see the history of present illness.     All other systems reviewed and are negative.  EKGs/Labs/Other Studies Reviewed:    The following studies were reviewed today:   EKG:  none today   Recent Labs: 07/31/2021: ALT 29; Magnesium 2.1; TSH 1.080 08/18/2021: BUN 11; Creatinine, Ser 1.29; Hemoglobin 16.0; Platelets 219; Potassium 4.2; Sodium 138   Recent Lipid Panel    Component Value Date/Time   CHOL 201 (H) 07/31/2021 0952   TRIG 116 07/31/2021 8757  HDL 57 07/31/2021 0952   CHOLHDL 3.5 07/31/2021 0952   LDLCALC 123 (H) 07/31/2021 0952     Risk Assessment/Calculations:           Physical Exam:    VS:    There were no vitals filed for this visit.    Wt Readings from Last 3 Encounters:  07/31/21 187 lb 12.8 oz (85.2 kg)  07/17/21 188 lb 3.2 oz (85.4 kg)  07/10/21 181 lb 4.8 oz (82.2 kg)     GEN:  Well nourished, well developed in no acute distress HEENT: Normal NECK: No JVD; No carotid bruits LYMPHATICS: No lymphadenopathy CARDIAC: RRR, no murmurs, rubs, gallops RESPIRATORY:  Clear to auscultation without rales, wheezing or rhonchi  ABDOMEN: Soft, non-tender, non-distended MUSCULOSKELETAL:  No edema; No deformity  SKIN: Warm and dry NEUROLOGIC:  Alert and oriented x 3 PSYCHIATRIC:  Normal affect   ASSESSMENT:     Syncope: With sudden loss of consciousness and no prodrome, more concerning for arrhythmia. Ziopatch did not show a significant arrhythmia. 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  Concerns regarding medicines are outlined above.  No orders of the defined types were placed in this encounter.  No orders of the defined types were placed in this encounter.   There are no Patient Instructions on file for this visit.   Signed, Maisie Fus, MD  05/06/2022 8:05 AM    [facility]

## 2022-05-12 ENCOUNTER — Ambulatory Visit: Payer: Medicaid Other | Attending: Internal Medicine | Admitting: Internal Medicine

## 2022-05-12 NOTE — Progress Notes (Deleted)
Cardiology Office Note:    Date:  05/12/2022   ID:  Oscar Bates, DOB [birth year], MRN [id number]  PCP:  Carlean Jews, NP   [facility] Providers Cardiologist:  Maisie Fus, MD     Referring MD: Carlean Jews, NP   No chief complaint on file. Syncope  History of Present Illness:    Oscar Bates is a 42 y.o. male ED referral for repeated syncopal episodes. Also described R sided chest wall pain. EKG was normal. Normal Qtc. He notes he was walking at work and then he passed out. He had no prodrome. He was walking from is car to another location and the next thing he knows, he was surrounded by his coworkers. He went to the ED and wait time was too long, so he left. Later, he was taking a shower and felt himself getting dizzy and presyncopal. His wife encouraged him to go to the ED. EKG was normal. No real conclusion in the ED. He also notes intermittent sharp chest pains on the R side. He takes tylenol and it that helps. No cardiac family hx. No sudden CD in family. This has never happened before. No new medicines, no dehydration prior.  He denies dizziness or LH. He is otherwise healthy  Interim Hx   Cardiology Studies: TTE: EF 50% with apical trabeculations with c/f non compaction.  No other significant abnormalities.   CMR: Trabeculated apex does not meet criteria for non compaction    Past Surgical History:  Procedure Laterality Date   CIRCUMCISION     HERNIA REPAIR     KNEE SURGERY     NASAL FRACTURE SURGERY      Current Medications: No outpatient medications have been marked as taking for the 05/12/22 encounter (Appointment) with Maisie Fus, MD.     Allergies:   Patient has no known allergies.   Social History   Socioeconomic History   Marital status: Single    Spouse name: Not on file   Number of children: Not on file   Years of education: Not on file   Highest education level: Not on file  Occupational History   Not on file  Tobacco Use    Smoking status: Never   Smokeless tobacco: Not on file  Vaping Use   Vaping Use: Never used  Substance and Sexual Activity   Alcohol use: Yes    Comment: occ   Drug use: No   Sexual activity: Yes    Birth control/protection: None  Other Topics Concern   Not on file  Social History Narrative   Not on file   Social Determinants of Health   Financial Resource Strain: Not on file  Food Insecurity: Not on file  Transportation Needs: Not on file  Physical Activity: Not on file  Stress: Not on file  Social Connections: Not on file     Family History: The patient's family history includes Healthy in his father and mother.  ROS:   Please see the history of present illness.     All other systems reviewed and are negative.  EKGs/Labs/Other Studies Reviewed:    The following studies were reviewed today:   EKG:  none today   Recent Labs: 07/31/2021: ALT 29; Magnesium 2.1; TSH 1.080 08/18/2021: BUN 11; Creatinine, Ser 1.29; Hemoglobin 16.0; Platelets 219; Potassium 4.2; Sodium 138   Recent Lipid Panel    Component Value Date/Time   CHOL 201 (H) 07/31/2021 0952   TRIG 116 07/31/2021 3664  HDL 57 07/31/2021 0952   CHOLHDL 3.5 07/31/2021 0952   LDLCALC 123 (H) 07/31/2021 0952     Risk Assessment/Calculations:           Physical Exam:    VS:    There were no vitals filed for this visit.    Wt Readings from Last 3 Encounters:  07/31/21 187 lb 12.8 oz (85.2 kg)  07/17/21 188 lb 3.2 oz (85.4 kg)  07/10/21 181 lb 4.8 oz (82.2 kg)     GEN:  Well nourished, well developed in no acute distress HEENT: Normal NECK: No JVD; No carotid bruits LYMPHATICS: No lymphadenopathy CARDIAC: RRR, no murmurs, rubs, gallops RESPIRATORY:  Clear to auscultation without rales, wheezing or rhonchi  ABDOMEN: Soft, non-tender, non-distended MUSCULOSKELETAL:  No edema; No deformity  SKIN: Warm and dry NEUROLOGIC:  Alert and oriented x 3 PSYCHIATRIC:  Normal affect   ASSESSMENT:     Syncope: With sudden loss of consciousness and no prodrome, more concerning for arrhythmia. Ziopatch did not show a significant arrhythmia. Echo showed trabeculations, however MRI did not meet criteria for non compaction.  Sinus of valsalva aneurysm: 4.5 cm 11/05/2021; FU imaging now  PLAN:    In order of problems listed above:  Chest CT Follow up in 12 months      Medication Adjustments/Labs and Tests Ordered: Current medicines are reviewed at length with the patient today.  Concerns regarding medicines are outlined above.  No orders of the defined types were placed in this encounter.  No orders of the defined types were placed in this encounter.   There are no Patient Instructions on file for this visit.   Signed, Maisie Fus, MD  05/12/2022 4:27 PM    [facility]

## 2022-08-02 IMAGING — MR MR KNEE*R* W/O CM
4 of 7 series · 21 of 40 positions shown · non-contrast
Comparison: X-ray knee 04/09/2021.

CLINICAL DATA: Right knee pain with weakness and decreased range of
motion for 2 weeks related to a work injury. Clinical concern for
ligament tear.

EXAM:
MRI OF THE RIGHT KNEE WITHOUT CONTRAST
TECHNIQUE: Multiplanar, multisequence MR imaging of the knee was performed. No
intravenous contrast was administered.

[Series 4: T2 fat-sat · axial · 4.0mm · 0.50mm/px · z∈[-66,+54]mm · 5 of 30 slices shown]
[im 1/30]
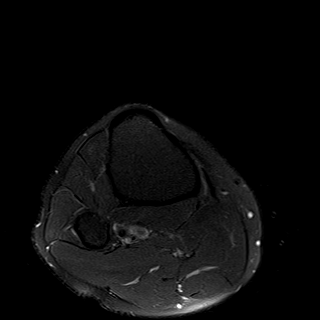
[im 5/30]
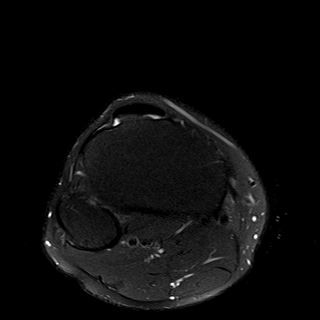
[im 10/30]
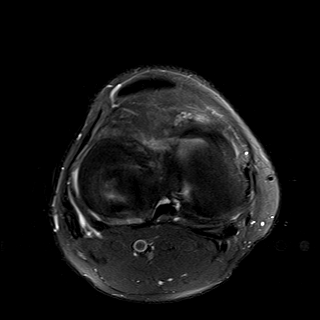
[im 15/30]
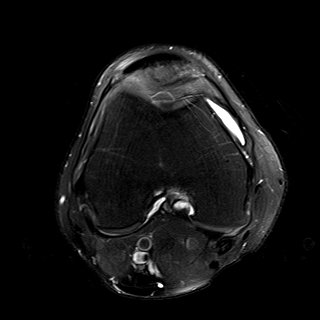
[im 25/30]
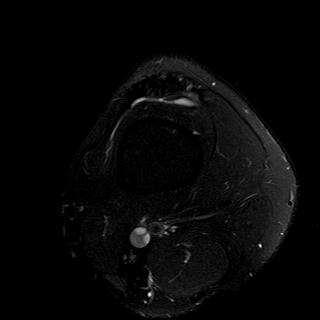

[Series 7: PD fat-sat · coronal · 3.0mm · 0.29mm/px · 7 of 35 slices shown (1 of 3)]
[im 1/35]
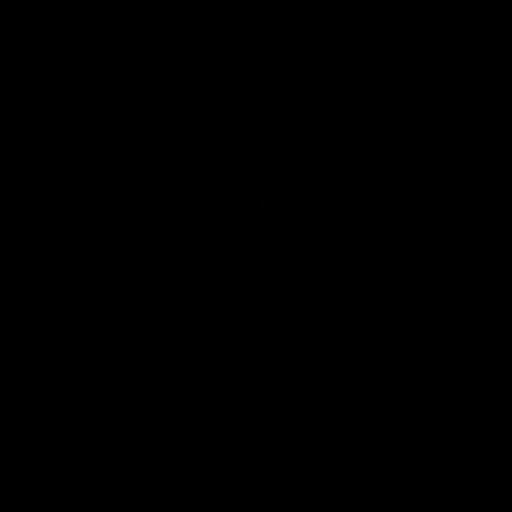
[im 6/35]
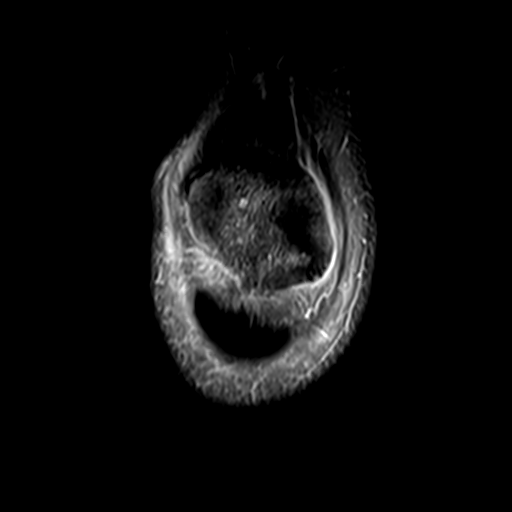
[im 12/35]
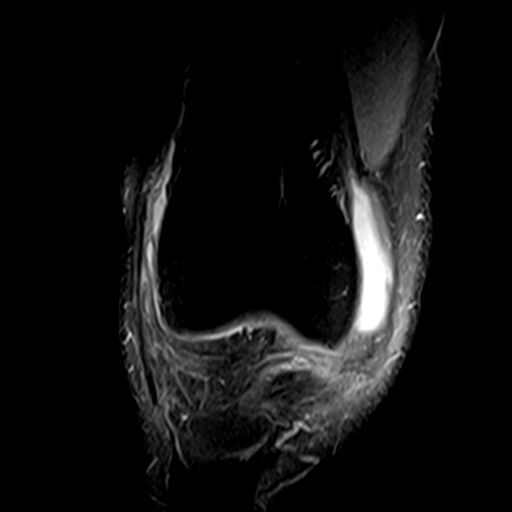
[im 18/35]
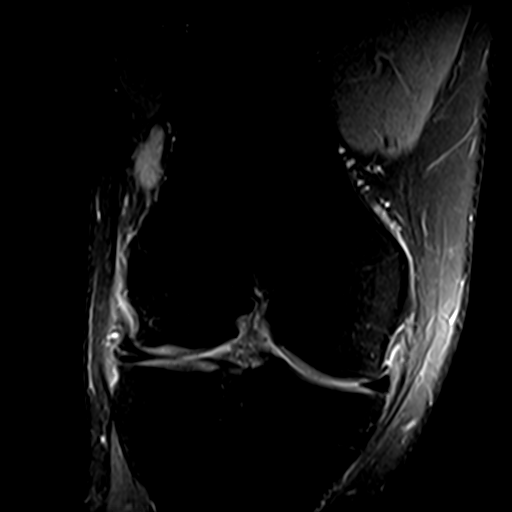
[im 23/35]
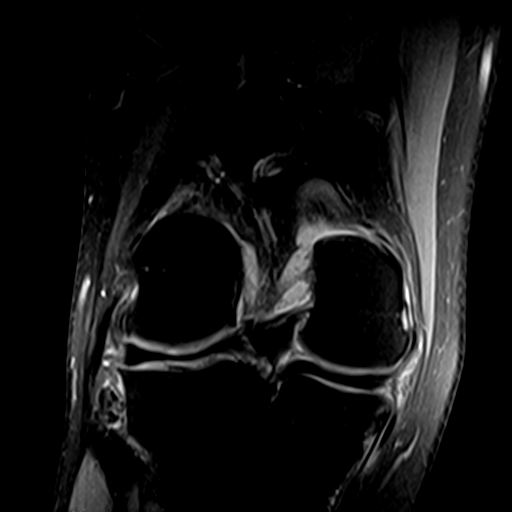
[im 29/35]
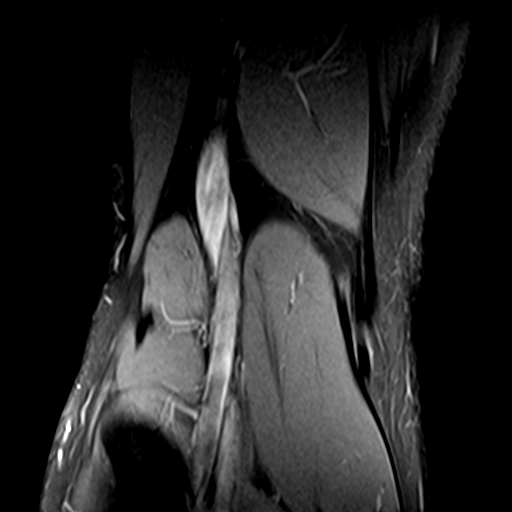
[im 35/35]
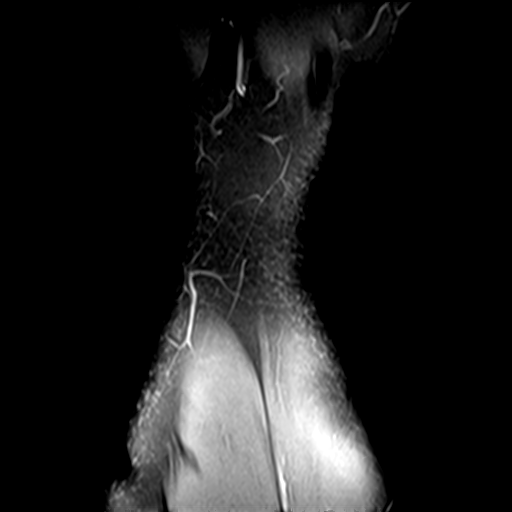

[Series 9: PD fat-sat · sagittal · 3.0mm · 0.31mm/px · 6 of 31 slices shown (2 of 3)]
[im 1/31]
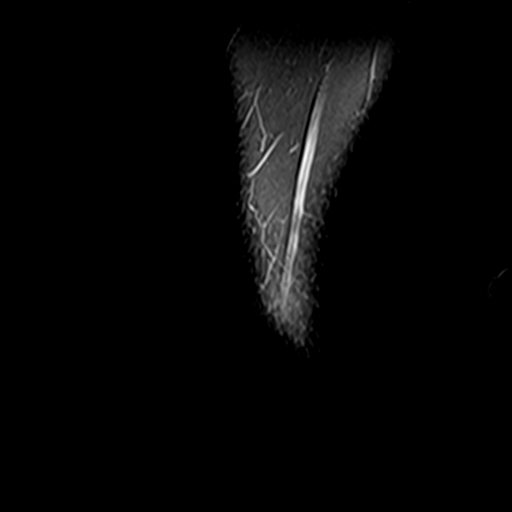
[im 7/31]
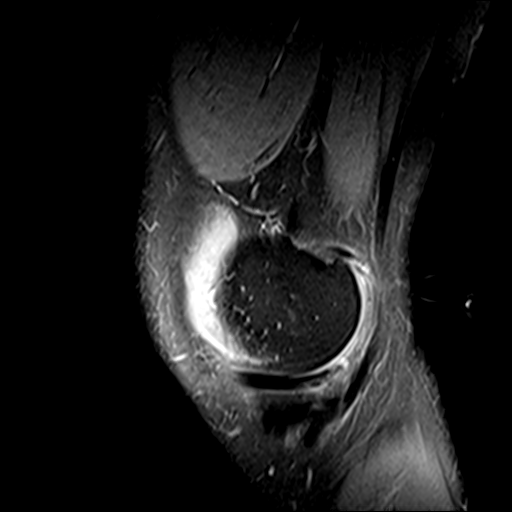
[im 13/31]
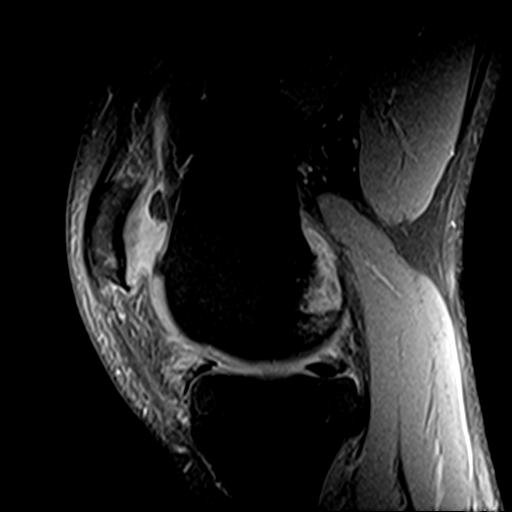
[im 19/31]
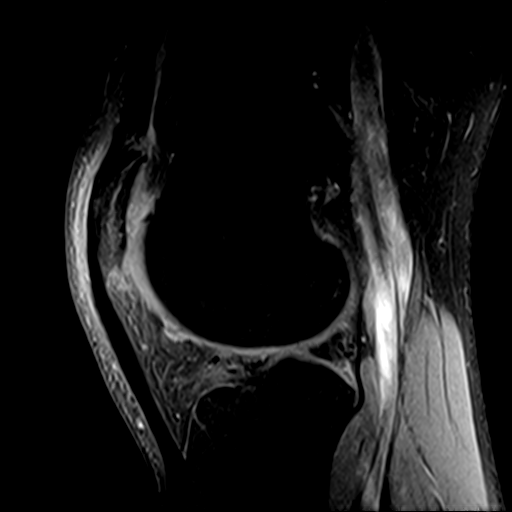
[im 25/31]
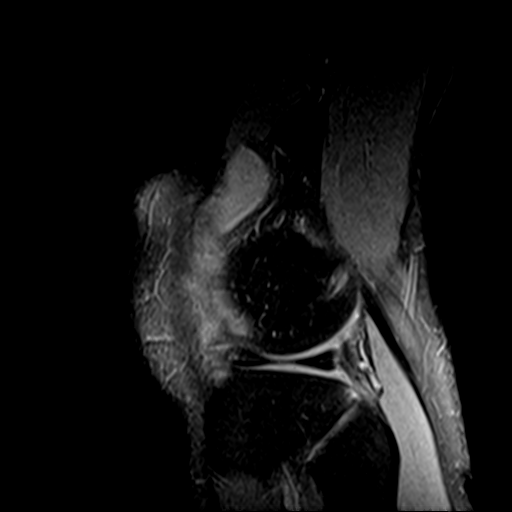
[im 31/31]
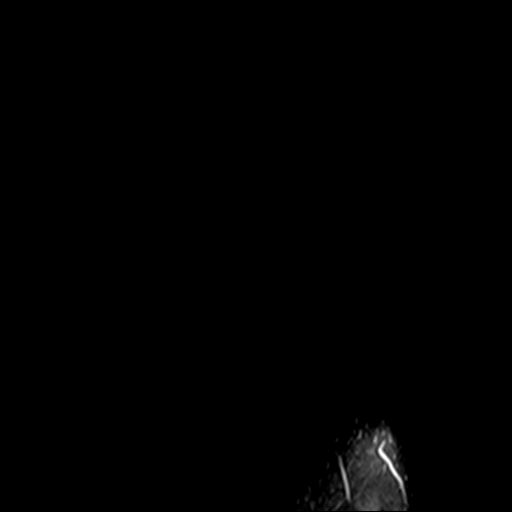

[Series 10: PD fat-sat · oblique · 2.3mm · 0.29mm/px · 3 of 16 slices shown (3 of 3)]
[im 1/16]
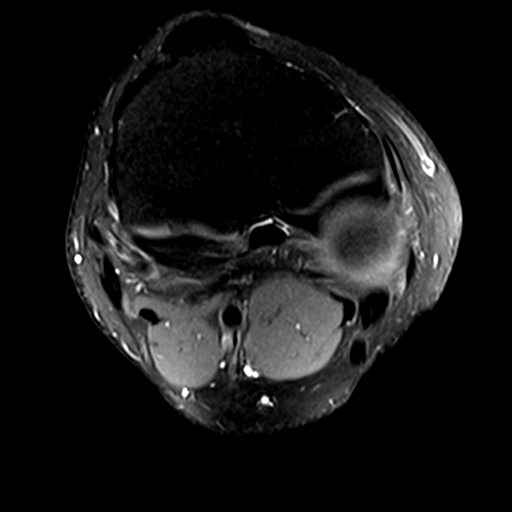
[im 8/16]
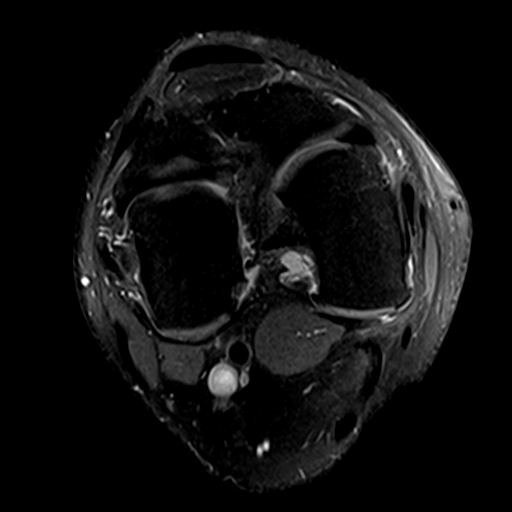
[im 16/16]
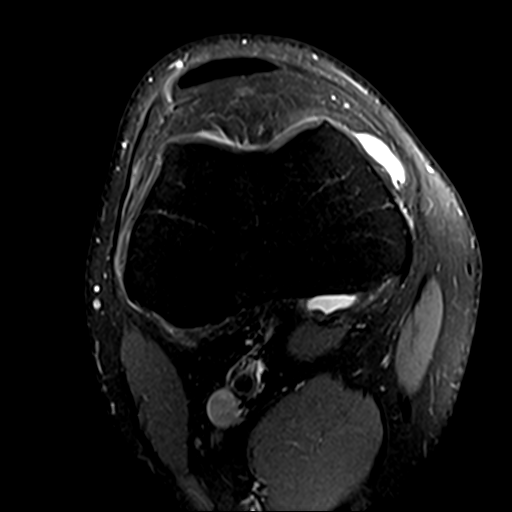

[21 of 40 positions shown; findings below may reference images not displayed]

FINDINGS: MENISCI

Medial meniscus: Irregular tear of the medial meniscal anterior horn
extending into the anterior root attachment site with small adjacent
7 x 7 mm parameniscal cyst.

Lateral meniscus: Complex tearing of the lateral meniscus with
horizontal and vertical components. There is a small bandlike area
of low signal intensity within the intercondylar notch concerning
for a flipped meniscal flap (series 9, images 15-16).

LIGAMENTS

Cruciates: Intact PCL. Normal ACL not visualized, and may be
chronically torn.

Collaterals: Intact MCL. Lateral collateral ligament complex intact.

CARTILAGE

Patellofemoral: Full-thickness cartilage defect of the lateral
patellar facet measuring approximately 6 x 5 mm. Additional areas of
full-thickness fissuring of the adjacent lateral patellar facet.
Partial-thickness chondral irregularity of the lateral trochlea.

Medial:  No chondral defect.

Lateral: Partial thickness cartilage defect of the lateral tibial
plateau centrally measuring approximately 10 x 6 mm.

MISCELLANEOUS

Joint:  Small joint effusion. Fat pads within normal limits.

Popliteal Fossa:  No Baker's cyst. Intact popliteus tendon.

Extensor Mechanism:  Intact quadriceps and patellar tendons.

Bones: Prominent bone marrow edema throughout the patella with
numerous subchondral cysts present laterally. No fracture is
identified. Mild tricompartmental joint space narrowing. No marrow
replacing bone lesion.

Other: Prepatellar subcutaneous edema.
IMPRESSION: 1. Complex tearing of the lateral meniscus with a small band-like
area of low signal intensity within the intercondylar notch
concerning for a flipped meniscal flap.
2. Irregular tear of the medial meniscal anterior horn extending
into the anterior root attachment site with small adjacent
parameniscal cyst.
3. A normal ACL is not visualized, and may be chronically torn.
4. Full-thickness cartilage defects of the lateral patellar facet
and lateral tibial plateau. Prominent bone marrow edema throughout
the patella with numerous subchondral cysts present laterally.
Marrow edema is likely reactive although a could reflect a component
of a superimposed bone contusion. No fracture identified.
5. Small joint effusion.

## 2022-08-20 ENCOUNTER — Encounter (HOSPITAL_COMMUNITY): Payer: Self-pay | Admitting: Emergency Medicine

## 2022-08-20 ENCOUNTER — Ambulatory Visit (INDEPENDENT_AMBULATORY_CARE_PROVIDER_SITE_OTHER): Payer: Medicaid Other

## 2022-08-20 ENCOUNTER — Ambulatory Visit (HOSPITAL_COMMUNITY)
Admission: EM | Admit: 2022-08-20 | Discharge: 2022-08-20 | Disposition: A | Discharge status: Home / Self Care | Payer: Medicaid Other | Attending: Internal Medicine | Admitting: Internal Medicine

## 2022-08-20 DIAGNOSIS — S83401A Sprain of unspecified collateral ligament of right knee, initial encounter: Secondary | ICD-10-CM

## 2022-08-20 MED ORDER — IBUPROFEN 800 MG PO TABS: 800.0000 mg | ORAL_TABLET | Freq: Three times a day (TID) | ORAL | 0 refills | Status: DC | PRN

## 2022-08-20 NOTE — ED Provider Notes (Signed)
[facility]    CSN: [id number] Arrival date & time: 08/20/22  1029      History   Chief Complaint Chief Complaint  Patient presents with   Knee Pain    HPI Oscar Bates is a 42 y.o. male comes to the urgent care with right wrist orthopedist.  Patient stepped on a toy and lost his balance while was descending down a flight of stairs.  Patient denies a fall.  Following that incident patient started experiencing severe pain in the right knee.  Pain is of moderate severity, sharp and aggravated by bearing weight.  Patient denies any relieving factors.  Patient denies any bruising of the knee.  Pain does not radiate.  It is associated with mild swelling of the right knee.  Patient endorses a remote history of right knee pain.  Patient denies knee giving away when he is walking.   HPI  Past Medical History:  Diagnosis Date   LVH (left ventricular hypertrophy)     Patient Active Problem List   Diagnosis Date Noted   Essential hypertension 07/31/2021   Other fatigue 07/31/2021   Right-sided chest pain 07/11/2021    Past Surgical History:  Procedure Laterality Date   CIRCUMCISION     HERNIA REPAIR     KNEE SURGERY     NASAL FRACTURE SURGERY         Home Medications    Prior to Admission medications   Medication Sig Start Date End Date Taking? Authorizing Provider  ibuprofen (ADVIL) 800 MG tablet Take 1 tablet (800 mg total) by mouth every 8 (eight) hours as needed. 08/20/22  Yes Danelia Snodgrass, Britta Mccreedy, MD  metoprolol succinate (TOPROL-XL) 25 MG 24 hr tablet TAKE 1/2 TABLET BY MOUTH EVERY DAY Patient not taking: Reported on 04/22/2022 02/17/22   Carlean Jews, NP    Family History Family History  Problem Relation Age of Onset   Healthy Mother    Healthy Father     Social History Social History   Tobacco Use   Smoking status: Never  Vaping Use   Vaping status: Never Used  Substance Use Topics   Alcohol use: Yes   Drug use: No     Allergies    Patient has no known allergies.   Review of Systems Review of Systems As per HPI  Physical Exam Triage Vital Signs ED Triage Vitals  Encounter Vitals Group     BP 08/20/22 1130 (!) 153/100     Systolic BP Percentile --      Diastolic BP Percentile --      Pulse Rate 08/20/22 1130 79     Resp 08/20/22 1130 15     Temp 08/20/22 1130 97.7 F (36.5 C)     Temp Source 08/20/22 1130 Oral     SpO2 08/20/22 1130 98 %     Weight --      Height --      Head Circumference --      Peak Flow --      Pain Score 08/20/22 1128 8     Pain Loc --      Pain Education --      Exclude from Growth Chart --    No data found.  Updated Vital Signs BP (!) 153/100 (BP Location: Left Arm)   Pulse 79   Temp 97.7 F (36.5 C) (Oral)   Resp 15   SpO2 98%   Visual Acuity Right Eye Distance:   Left Eye Distance:  Bilateral Distance:    Right Eye Near:   Left Eye Near:    Bilateral Near:     Physical Exam Vitals and nursing note reviewed.  Constitutional:      General: He is in acute distress.     Appearance: He is not ill-appearing.  Cardiovascular:     Rate and Rhythm: Normal rate and regular rhythm.     Pulses: Normal pulses.     Heart sounds: Normal heart sounds.  Musculoskeletal:     Comments: Limited range of motion of the right knee secondary to pain.  Patella is not ballotable.  Patient could not tolerate anterior drawer test.  Skin:    General: Skin is warm.     Findings: No bruising or erythema.  Neurological:     Mental Status: He is alert.      UC Treatments / Results  Labs (all labs ordered are listed, but only abnormal results are displayed) Labs Reviewed - No data to display  EKG   Radiology DG Knee Complete 4 Views Right  Result Date: 08/20/2022 CLINICAL DATA:  Right knee pain after trauma yesterday. Yesterday stepped on a toy and heard a pop. Swelling. EXAM: RIGHT KNEE - COMPLETE 4+ VIEW COMPARISON:  Right knee radiographs 04/09/2021; MRI right knee  04/19/2021 FINDINGS: There is diffuse decreased bone mineralization. Mild medial compartment joint space narrowing. Mild peripheral medial and lateral compartment degenerative osteophytosis. Moderate patellofemoral joint space narrowing and mild peripheral osteophytosis. No joint effusion. IMPRESSION: 1. No acute fracture or dislocation. 2. 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 Electronically Signed   By: Neita Garnet M.D.   On: 08/20/2022 13:24    Procedures Procedures (including critical care time)  Medications Ordered in UC Medications - No data to display  Initial Impression / Assessment and Plan / UC Course  I have reviewed the triage vital signs and the nursing notes.  Pertinent labs & imaging results that were available during my care of the patient were reviewed by me and considered in my medical decision making (see chart for details).     1.  Right knee sprain: X-ray of the right patellofemoral and medial compartment osteoarthritis No acute fractures on X-ray Icing the right knee right knee sleeve Ibuprofen as needed for pain Gentle range of motion exercises as the patient will see [place] Return precautions given.  Final Clinical Impressions(s) / UC Diagnoses   Final diagnoses:  Sprain of collateral ligament of right knee, initial encounter     Discharge Instructions      Please wear your knee sleeve Rest and elevate the affected painful area. Apply cold compresses intermittently as needed. Please take medications as prescribed As pain recedes, begin normal activities slowly as tolerated. Return to urgent care if symptoms persist.    ED Prescriptions     Medication Sig Dispense Auth. Provider   ibuprofen (ADVIL) 800 MG tablet Take 1 tablet (800 mg total) by mouth every 8 (eight) hours as needed. 21 tablet Lyden Redner, Britta Mccreedy, MD      PDMP not reviewed this encounter.   Merrilee Jansky, MD 08/20/22 2145

## 2022-08-20 NOTE — ED Triage Notes (Signed)
Pt c/o right knee pain onset yesterday. He states this is an old injury but yesterday he stepped on a toy and heard a pop. Pt states it has been swelling.

## 2022-08-20 NOTE — Discharge Instructions (Addendum)
Please wear your knee sleeve Rest and elevate the affected painful area. Apply cold compresses intermittently as needed. Please take medications as prescribed As pain recedes, begin normal activities slowly as tolerated. Return to urgent care if symptoms persist.

## 2022-09-18 ENCOUNTER — Encounter (HOSPITAL_COMMUNITY): Payer: Self-pay

## 2022-09-18 ENCOUNTER — Ambulatory Visit (HOSPITAL_COMMUNITY)
Admission: EM | Admit: 2022-09-18 | Discharge: 2022-09-18 | Disposition: A | Discharge status: Home / Self Care | Payer: Medicaid Other | Attending: Emergency Medicine | Admitting: Emergency Medicine

## 2022-09-18 DIAGNOSIS — H1033 Unspecified acute conjunctivitis, bilateral: Secondary | ICD-10-CM

## 2022-09-18 DIAGNOSIS — R519 Headache, unspecified: Secondary | ICD-10-CM

## 2022-09-18 DIAGNOSIS — M545 Low back pain, unspecified: Secondary | ICD-10-CM

## 2022-09-18 LAB — POCT URINALYSIS DIP (MANUAL ENTRY)
Bilirubin, UA: NEGATIVE
Blood, UA: NEGATIVE
Glucose, UA: NEGATIVE mg/dL
Ketones, POC UA: NEGATIVE mg/dL
Leukocytes, UA: NEGATIVE
Nitrite, UA: NEGATIVE
Protein Ur, POC: NEGATIVE mg/dL
Spec Grav, UA: 1.025 (ref 1.010–1.025)
Urobilinogen, UA: 0.2 E.U./dL
pH, UA: 6.5 (ref 5.0–8.0)

## 2022-09-18 MED ORDER — CIPROFLOXACIN HCL 0.3 % OP SOLN: 1.0000 [drp] | OPHTHALMIC | 0 refills | Status: AC

## 2022-09-18 MED ORDER — LIDOCAINE 5 % EX PTCH: 1.0000 | MEDICATED_PATCH | CUTANEOUS | 0 refills | Status: AC

## 2022-09-18 MED ORDER — IBUPROFEN 800 MG PO TABS: 800.0000 mg | ORAL_TABLET | Freq: Three times a day (TID) | ORAL | 0 refills | Status: AC

## 2022-09-18 MED ORDER — CYCLOBENZAPRINE HCL 10 MG PO TABS: 10.0000 mg | ORAL_TABLET | Freq: Three times a day (TID) | ORAL | 0 refills | Status: DC | PRN
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
## 2022-09-18 NOTE — Discharge Instructions (Signed)
Please use the eye drops as directed  You can take the muscle relaxer three times daily. If the medication makes you drowsy, take only at bed time. Ibuprofen can be used every 6 hours, with food Lidocaine patch can be applied for 12 hours at a time Avoid heavy lifting and strenuous activity It may take several days for symptoms to improve. Please return or follow up with your primary care provider if needed.
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
## 2023-05-01 ENCOUNTER — Encounter (HOSPITAL_COMMUNITY): Payer: Self-pay | Admitting: Emergency Medicine

## 2023-05-01 ENCOUNTER — Ambulatory Visit: Admission: EM | Admit: 2023-05-01 | Discharge: 2023-05-01 | Disposition: A | Discharge status: Home / Self Care

## 2023-05-01 ENCOUNTER — Emergency Department (HOSPITAL_COMMUNITY)
Admission: EM | Admit: 2023-05-01 | Discharge: 2023-05-01 | Disposition: A | Discharge status: Home / Self Care | Attending: Emergency Medicine | Admitting: Emergency Medicine

## 2023-05-01 ENCOUNTER — Emergency Department (HOSPITAL_COMMUNITY)

## 2023-05-01 ENCOUNTER — Encounter: Payer: Self-pay | Admitting: Emergency Medicine

## 2023-05-01 ENCOUNTER — Other Ambulatory Visit: Payer: Self-pay

## 2023-05-01 DIAGNOSIS — M542 Cervicalgia: Secondary | ICD-10-CM

## 2023-05-01 DIAGNOSIS — S39012A Strain of muscle, fascia and tendon of lower back, initial encounter: Secondary | ICD-10-CM | POA: Insufficient documentation

## 2023-05-01 DIAGNOSIS — S060X1A Concussion with loss of consciousness of 30 minutes or less, initial encounter: Secondary | ICD-10-CM | POA: Diagnosis not present

## 2023-05-01 DIAGNOSIS — M549 Dorsalgia, unspecified: Secondary | ICD-10-CM | POA: Diagnosis not present

## 2023-05-01 DIAGNOSIS — R310 Gross hematuria: Secondary | ICD-10-CM

## 2023-05-01 DIAGNOSIS — S0990XA Unspecified injury of head, initial encounter: Secondary | ICD-10-CM | POA: Diagnosis present

## 2023-05-01 DIAGNOSIS — R4020 Unspecified coma: Secondary | ICD-10-CM

## 2023-05-01 DIAGNOSIS — R519 Headache, unspecified: Secondary | ICD-10-CM

## 2023-05-01 DIAGNOSIS — Y92410 Unspecified street and highway as the place of occurrence of the external cause: Secondary | ICD-10-CM | POA: Diagnosis not present

## 2023-05-01 DIAGNOSIS — R319 Hematuria, unspecified: Secondary | ICD-10-CM | POA: Diagnosis not present

## 2023-05-01 LAB — URINALYSIS, ROUTINE W REFLEX MICROSCOPIC
Bilirubin Urine: NEGATIVE
Glucose, UA: 50 mg/dL — AB
Hgb urine dipstick: NEGATIVE
Ketones, ur: NEGATIVE mg/dL
Leukocytes,Ua: NEGATIVE
Nitrite: NEGATIVE
Protein, ur: NEGATIVE mg/dL
Specific Gravity, Urine: 1.027 (ref 1.005–1.030)
pH: 6 (ref 5.0–8.0)

## 2023-05-01 MED ORDER — CYCLOBENZAPRINE HCL 10 MG PO TABS: 5.0000 mg | ORAL_TABLET | Freq: Every evening | ORAL | 0 refills | Status: AC | PRN
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
## 2023-06-07 ENCOUNTER — Encounter (HOSPITAL_COMMUNITY): Payer: Self-pay

## 2023-06-14 ENCOUNTER — Ambulatory Visit: Admitting: Family Medicine

## 2023-07-08 ENCOUNTER — Ambulatory Visit: Admitting: Family Medicine

## 2023-08-10 ENCOUNTER — Ambulatory Visit: Admitting: Family Medicine
